# Patient Record
Sex: Female | Born: 1975
Health system: Southern US, Community
[De-identification: ages and names within clinical notes are randomized; demographics above are authoritative.]

## PROBLEM LIST (undated history)

## (undated) DIAGNOSIS — E039 Hypothyroidism, unspecified: Secondary | ICD-10-CM

## (undated) DIAGNOSIS — N289 Disorder of kidney and ureter, unspecified: Secondary | ICD-10-CM

## (undated) DIAGNOSIS — M1990 Unspecified osteoarthritis, unspecified site: Secondary | ICD-10-CM

## (undated) DIAGNOSIS — Z5189 Encounter for other specified aftercare: Secondary | ICD-10-CM

## (undated) DIAGNOSIS — T4145XA Adverse effect of unspecified anesthetic, initial encounter: Secondary | ICD-10-CM

## (undated) DIAGNOSIS — E079 Disorder of thyroid, unspecified: Secondary | ICD-10-CM

## (undated) DIAGNOSIS — J984 Other disorders of lung: Secondary | ICD-10-CM

## (undated) DIAGNOSIS — N730 Acute parametritis and pelvic cellulitis: Secondary | ICD-10-CM

## (undated) DIAGNOSIS — K219 Gastro-esophageal reflux disease without esophagitis: Secondary | ICD-10-CM

## (undated) DIAGNOSIS — N938 Other specified abnormal uterine and vaginal bleeding: Secondary | ICD-10-CM

## (undated) DIAGNOSIS — D649 Anemia, unspecified: Secondary | ICD-10-CM

## (undated) HISTORY — DX: Other specified abnormal uterine and vaginal bleeding: N93.8

## (undated) HISTORY — DX: Anemia, unspecified: D64.9

## (undated) HISTORY — DX: Acute parametritis and pelvic cellulitis: N73.0

## (undated) HISTORY — DX: Other disorders of lung: J98.4

## (undated) HISTORY — PX: OTHER SURGICAL HISTORY: SHX169

---

## 1998-05-18 HISTORY — PX: CHOLECYSTECTOMY: SHX55

## 2003-05-19 HISTORY — PX: TUBAL LIGATION: SHX77

## 2009-05-06 ENCOUNTER — Ambulatory Visit: Payer: Self-pay | Admitting: Critical Care Medicine

## 2009-05-07 ENCOUNTER — Ambulatory Visit: Payer: Self-pay | Admitting: Cardiology

## 2009-05-07 ENCOUNTER — Encounter (INDEPENDENT_AMBULATORY_CARE_PROVIDER_SITE_OTHER): Payer: Self-pay | Admitting: Internal Medicine

## 2009-05-07 ENCOUNTER — Inpatient Hospital Stay (HOSPITAL_COMMUNITY): Admission: EM | Admit: 2009-05-07 | Discharge: 2009-05-14 | Payer: Self-pay | Admitting: Emergency Medicine

## 2009-05-08 ENCOUNTER — Encounter (INDEPENDENT_AMBULATORY_CARE_PROVIDER_SITE_OTHER): Payer: Self-pay | Admitting: Internal Medicine

## 2009-05-08 ENCOUNTER — Ambulatory Visit: Payer: Self-pay | Admitting: Surgery

## 2009-05-26 ENCOUNTER — Emergency Department (HOSPITAL_COMMUNITY): Admission: EM | Admit: 2009-05-26 | Discharge: 2009-05-26 | Payer: Self-pay | Admitting: Emergency Medicine

## 2009-08-09 ENCOUNTER — Ambulatory Visit: Payer: Self-pay | Admitting: Cardiology

## 2009-08-09 ENCOUNTER — Inpatient Hospital Stay (HOSPITAL_COMMUNITY)
Admission: EM | Admit: 2009-08-09 | Discharge: 2009-08-14 | Payer: Self-pay | Source: Home / Self Care | Admitting: Emergency Medicine

## 2009-08-09 ENCOUNTER — Ambulatory Visit: Payer: Self-pay | Admitting: Pulmonary Disease

## 2009-08-10 ENCOUNTER — Encounter (INDEPENDENT_AMBULATORY_CARE_PROVIDER_SITE_OTHER): Payer: Self-pay | Admitting: Internal Medicine

## 2009-08-13 ENCOUNTER — Encounter (INDEPENDENT_AMBULATORY_CARE_PROVIDER_SITE_OTHER): Payer: Self-pay | Admitting: Internal Medicine

## 2010-01-13 ENCOUNTER — Inpatient Hospital Stay (HOSPITAL_COMMUNITY): Admission: EM | Admit: 2010-01-13 | Discharge: 2010-01-14 | Payer: Self-pay | Admitting: Emergency Medicine

## 2010-05-18 HISTORY — PX: MULTIPLE TOOTH EXTRACTIONS: SHX2053

## 2010-06-08 ENCOUNTER — Encounter: Payer: Self-pay | Admitting: Surgery

## 2010-08-01 LAB — URINALYSIS, ROUTINE W REFLEX MICROSCOPIC
Bilirubin Urine: NEGATIVE
Bilirubin Urine: NEGATIVE
Glucose, UA: NEGATIVE mg/dL
Glucose, UA: NEGATIVE mg/dL
Protein, ur: 100 mg/dL — AB
Protein, ur: >300 mg/dL — AB
Specific Gravity, Urine: 1.008 (ref 1.005–1.030)
Urobilinogen, UA: 0.2 mg/dL (ref 0.0–1.0)
Urobilinogen, UA: 1 mg/dL (ref 0.0–1.0)

## 2010-08-01 LAB — DIFFERENTIAL
Basophils Absolute: 0 10*3/uL (ref 0.0–0.1)
Basophils Relative: 0 % (ref 0–1)
Eosinophils Absolute: 0.3 10*3/uL (ref 0.0–0.7)
Monocytes Relative: 7 % (ref 3–12)
Neutrophils Relative %: 55 % (ref 43–77)

## 2010-08-01 LAB — BASIC METABOLIC PANEL
BUN: 13 mg/dL (ref 6–23)
BUN: 14 mg/dL (ref 6–23)
CO2: 22 mEq/L (ref 19–32)
CO2: 25 mEq/L (ref 19–32)
Chloride: 114 mEq/L — ABNORMAL HIGH (ref 96–112)
Creatinine, Ser: 1.85 mg/dL — ABNORMAL HIGH (ref 0.4–1.2)
Glucose, Bld: 83 mg/dL (ref 70–99)
Glucose, Bld: 87 mg/dL (ref 70–99)
Potassium: 4.2 mEq/L (ref 3.5–5.1)
Potassium: 4.3 mEq/L (ref 3.5–5.1)
Sodium: 141 mEq/L (ref 135–145)

## 2010-08-01 LAB — CK: Total CK: 758 U/L — ABNORMAL HIGH (ref 7–177)

## 2010-08-01 LAB — TSH: TSH: 49.977 u[IU]/mL — ABNORMAL HIGH (ref 0.350–4.500)

## 2010-08-01 LAB — CBC
MCHC: 34.6 g/dL (ref 30.0–36.0)
RDW: 17.7 % — ABNORMAL HIGH (ref 11.5–15.5)

## 2010-08-01 LAB — POCT I-STAT, CHEM 8
BUN: 15 mg/dL (ref 6–23)
Calcium, Ion: 0.88 mmol/L — ABNORMAL LOW (ref 1.12–1.32)
Creatinine, Ser: 2.7 mg/dL — ABNORMAL HIGH (ref 0.4–1.2)
Hemoglobin: 13.6 g/dL (ref 12.0–15.0)
Sodium: 138 mEq/L (ref 135–145)
TCO2: 16 mmol/L (ref 0–100)

## 2010-08-01 LAB — URINE CULTURE: Colony Count: NO GROWTH

## 2010-08-01 LAB — URINE MICROSCOPIC-ADD ON

## 2010-08-03 LAB — CBC
HCT: 31.1 % — ABNORMAL LOW (ref 36.0–46.0)
MCHC: 34.5 g/dL (ref 30.0–36.0)
MCV: 78.1 fL (ref 78.0–100.0)
Platelets: 117 10*3/uL — ABNORMAL LOW (ref 150–400)
RDW: 15.2 % (ref 11.5–15.5)

## 2010-08-03 LAB — POCT I-STAT, CHEM 8
Creatinine, Ser: 1.4 mg/dL — ABNORMAL HIGH (ref 0.4–1.2)
HCT: 32 % — ABNORMAL LOW (ref 36.0–46.0)
Hemoglobin: 10.9 g/dL — ABNORMAL LOW (ref 12.0–15.0)
Sodium: 139 mEq/L (ref 135–145)
TCO2: 21 mmol/L (ref 0–100)

## 2010-08-03 LAB — DIFFERENTIAL
Basophils Relative: 0 % (ref 0–1)
Eosinophils Absolute: 0 10*3/uL (ref 0.0–0.7)
Eosinophils Relative: 0 % (ref 0–5)
Lymphs Abs: 0.6 10*3/uL — ABNORMAL LOW (ref 0.7–4.0)
Neutrophils Relative %: 77 % (ref 43–77)

## 2010-08-11 LAB — BASIC METABOLIC PANEL
BUN: 30 mg/dL — ABNORMAL HIGH (ref 6–23)
BUN: 32 mg/dL — ABNORMAL HIGH (ref 6–23)
BUN: 33 mg/dL — ABNORMAL HIGH (ref 6–23)
Calcium: 8.8 mg/dL (ref 8.4–10.5)
Chloride: 116 mEq/L — ABNORMAL HIGH (ref 96–112)
Chloride: 118 mEq/L — ABNORMAL HIGH (ref 96–112)
Creatinine, Ser: 0.96 mg/dL (ref 0.4–1.2)
Creatinine, Ser: 1.19 mg/dL (ref 0.4–1.2)
Creatinine, Ser: 1.19 mg/dL (ref 0.4–1.2)
GFR calc Af Amer: >60 mL/min (ref >60)
GFR calc Af Amer: >60 mL/min (ref >60)
GFR calc non Af Amer: 52 mL/min — ABNORMAL LOW (ref >60)
GFR calc non Af Amer: 52 mL/min — ABNORMAL LOW (ref >60)
GFR calc non Af Amer: >60 mL/min (ref >60)
GFR calc non Af Amer: >60 mL/min (ref >60)
Glucose, Bld: 154 mg/dL — ABNORMAL HIGH (ref 70–99)
Glucose, Bld: 157 mg/dL — ABNORMAL HIGH (ref 70–99)
Potassium: 3.9 mEq/L (ref 3.5–5.1)
Potassium: 4.5 mEq/L (ref 3.5–5.1)
Potassium: 4.7 mEq/L (ref 3.5–5.1)
Sodium: 142 mEq/L (ref 135–145)

## 2010-08-11 LAB — URINALYSIS, ROUTINE W REFLEX MICROSCOPIC
Bilirubin Urine: NEGATIVE
Protein, ur: NEGATIVE mg/dL
Urobilinogen, UA: 0.2 mg/dL (ref 0.0–1.0)

## 2010-08-11 LAB — CBC
HCT: 27.4 % — ABNORMAL LOW (ref 36.0–46.0)
HCT: 29.3 % — ABNORMAL LOW (ref 36.0–46.0)
Hemoglobin: 10 g/dL — ABNORMAL LOW (ref 12.0–15.0)
Hemoglobin: 9.2 g/dL — ABNORMAL LOW (ref 12.0–15.0)
Hemoglobin: 9.3 g/dL — ABNORMAL LOW (ref 12.0–15.0)
MCHC: 32.5 g/dL (ref 30.0–36.0)
MCHC: 33.5 g/dL (ref 30.0–36.0)
MCV: 78.9 fL (ref 78.0–100.0)
MCV: 79.7 fL (ref 78.0–100.0)
MCV: 80.1 fL (ref 78.0–100.0)
MCV: 80.3 fL (ref 78.0–100.0)
Platelets: 127 10*3/uL — ABNORMAL LOW (ref 150–400)
Platelets: 129 10*3/uL — ABNORMAL LOW (ref 150–400)
Platelets: 130 10*3/uL — ABNORMAL LOW (ref 150–400)
Platelets: 97 10*3/uL — ABNORMAL LOW (ref 150–400)
RBC: 3.45 MIL/uL — ABNORMAL LOW (ref 3.87–5.11)
RBC: 3.47 MIL/uL — ABNORMAL LOW (ref 3.87–5.11)
RBC: 3.84 MIL/uL — ABNORMAL LOW (ref 3.87–5.11)
RBC: 4.39 MIL/uL (ref 3.87–5.11)
RDW: 14.4 % (ref 11.5–15.5)
RDW: 14.4 % (ref 11.5–15.5)
RDW: 14.6 % (ref 11.5–15.5)
WBC: 5.9 10*3/uL (ref 4.0–10.5)
WBC: 6.9 10*3/uL (ref 4.0–10.5)
WBC: 7.5 10*3/uL (ref 4.0–10.5)
WBC: 9.1 10*3/uL (ref 4.0–10.5)

## 2010-08-11 LAB — POCT CARDIAC MARKERS
CKMB, poc: 1 ng/mL (ref 1.0–8.0)
Myoglobin, poc: 194 ng/mL (ref 12–200)

## 2010-08-11 LAB — COMPREHENSIVE METABOLIC PANEL
ALT: 32 U/L (ref 0–35)
ALT: 66 U/L — ABNORMAL HIGH (ref 0–35)
AST: 15 U/L (ref 0–37)
AST: 28 U/L (ref 0–37)
AST: 57 U/L — ABNORMAL HIGH (ref 0–37)
Alkaline Phosphatase: 76 U/L (ref 39–117)
BUN: 39 mg/dL — ABNORMAL HIGH (ref 6–23)
CO2: 20 mEq/L (ref 19–32)
CO2: 21 mEq/L (ref 19–32)
CO2: 21 mEq/L (ref 19–32)
Calcium: 8.7 mg/dL (ref 8.4–10.5)
Calcium: 9.4 mg/dL (ref 8.4–10.5)
Chloride: 116 mEq/L — ABNORMAL HIGH (ref 96–112)
Chloride: 116 mEq/L — ABNORMAL HIGH (ref 96–112)
Creatinine, Ser: 1.23 mg/dL — ABNORMAL HIGH (ref 0.4–1.2)
GFR calc Af Amer: >60 mL/min (ref >60)
GFR calc Af Amer: >60 mL/min (ref >60)
GFR calc Af Amer: >60 mL/min (ref >60)
GFR calc non Af Amer: 50 mL/min — ABNORMAL LOW (ref >60)
GFR calc non Af Amer: >60 mL/min (ref >60)
Glucose, Bld: 194 mg/dL — ABNORMAL HIGH (ref 70–99)
Potassium: 4 mEq/L (ref 3.5–5.1)
Sodium: 139 mEq/L (ref 135–145)
Sodium: 142 mEq/L (ref 135–145)
Total Bilirubin: 0.3 mg/dL (ref 0.3–1.2)
Total Bilirubin: 0.3 mg/dL (ref 0.3–1.2)
Total Protein: 6.9 g/dL (ref 6.0–8.3)

## 2010-08-11 LAB — BLOOD GAS, ARTERIAL
Acid-base deficit: 13.4 mmol/L — ABNORMAL HIGH (ref 0.0–2.0)
Acid-base deficit: 15.3 mmol/L — ABNORMAL HIGH (ref 0.0–2.0)
Bicarbonate: 11.8 mEq/L — ABNORMAL LOW (ref 20.0–24.0)
Bicarbonate: 14.1 mEq/L — ABNORMAL LOW (ref 20.0–24.0)
O2 Content: 5 L/min
O2 Saturation: 99 %
O2 Saturation: 99 %
Patient temperature: 98.6
Patient temperature: 98.6
TCO2: 12.1 mmol/L (ref 0–100)
TCO2: 15.1 mmol/L (ref 0–100)
pCO2 arterial: 25 mmHg — ABNORMAL LOW (ref 35.0–45.0)
pCO2 arterial: 30.9 mmHg — ABNORMAL LOW (ref 35.0–45.0)
pH, Arterial: 7.281 — ABNORMAL LOW (ref 7.350–7.400)
pO2, Arterial: 115 mmHg — ABNORMAL HIGH (ref 80.0–100.0)

## 2010-08-11 LAB — T3, FREE: T3, Free: 8.9 pg/mL — ABNORMAL HIGH (ref 2.3–4.2)

## 2010-08-11 LAB — TYPE AND SCREEN: Antibody Screen: NEGATIVE

## 2010-08-11 LAB — CARDIAC PANEL(CRET KIN+CKTOT+MB+TROPI)
CK, MB: 0.7 ng/mL (ref 0.3–4.0)
CK, MB: 1.4 ng/mL (ref 0.3–4.0)
Relative Index: INVALID (ref 0.0–2.5)
Relative Index: INVALID (ref 0.0–2.5)
Total CK: 22 U/L (ref 7–177)
Total CK: 31 U/L (ref 7–177)
Total CK: 36 U/L (ref 7–177)
Troponin I: 0.01 ng/mL (ref 0.00–0.06)
Troponin I: 0.08 ng/mL — ABNORMAL HIGH (ref 0.00–0.06)
Troponin I: 0.09 ng/mL — ABNORMAL HIGH (ref 0.00–0.06)

## 2010-08-11 LAB — DIFFERENTIAL
Eosinophils Absolute: 0.1 10*3/uL (ref 0.0–0.7)
Eosinophils Relative: 1 % (ref 0–5)
Lymphs Abs: 2.9 10*3/uL (ref 0.7–4.0)
Monocytes Relative: 11 % (ref 3–12)

## 2010-08-11 LAB — MAGNESIUM
Magnesium: 1.8 mg/dL (ref 1.5–2.5)
Magnesium: 1.9 mg/dL (ref 1.5–2.5)

## 2010-08-11 LAB — T3: T3, Total: 81.1 ng/dl (ref 80.0–204.0)

## 2010-08-11 LAB — URINE MICROSCOPIC-ADD ON

## 2010-08-11 LAB — LACTIC ACID, PLASMA: Lactic Acid, Venous: 9 mmol/L — ABNORMAL HIGH (ref 0.5–2.2)

## 2010-08-11 LAB — RAPID URINE DRUG SCREEN, HOSP PERFORMED
Amphetamines: NOT DETECTED
Cocaine: NOT DETECTED
Opiates: NOT DETECTED
Tetrahydrocannabinol: NOT DETECTED

## 2010-08-11 LAB — HEPARIN INDUCED THROMBOCYTOPENIA PNL
Patient O.D.: 0.256
UFH Low Dose 0.1 IU/mL: 0 % Release

## 2010-08-11 LAB — TSH: TSH: 0.006 u[IU]/mL — ABNORMAL LOW (ref 0.350–4.500)

## 2010-08-18 LAB — BLOOD GAS, ARTERIAL
Acid-base deficit: 4.2 mmol/L — ABNORMAL HIGH (ref 0.0–2.0)
O2 Saturation: 98.7 %
pO2, Arterial: 104 mmHg — ABNORMAL HIGH (ref 80.0–100.0)

## 2010-08-18 LAB — COMPREHENSIVE METABOLIC PANEL
ALT: 21 U/L (ref 0–35)
ALT: 30 U/L (ref 0–35)
AST: 23 U/L (ref 0–37)
AST: 40 U/L — ABNORMAL HIGH (ref 0–37)
Albumin: 2.9 g/dL — ABNORMAL LOW (ref 3.5–5.2)
Albumin: 3.1 g/dL — ABNORMAL LOW (ref 3.5–5.2)
Alkaline Phosphatase: 88 U/L (ref 39–117)
Alkaline Phosphatase: 93 U/L (ref 39–117)
BUN: 20 mg/dL (ref 6–23)
BUN: 24 mg/dL — ABNORMAL HIGH (ref 6–23)
CO2: 19 mEq/L (ref 19–32)
CO2: 22 mEq/L (ref 19–32)
CO2: 23 mEq/L (ref 19–32)
Calcium: 8.3 mg/dL — ABNORMAL LOW (ref 8.4–10.5)
Calcium: 9.1 mg/dL (ref 8.4–10.5)
Chloride: 116 mEq/L — ABNORMAL HIGH (ref 96–112)
Chloride: 116 mEq/L — ABNORMAL HIGH (ref 96–112)
Creatinine, Ser: 1.18 mg/dL (ref 0.4–1.2)
GFR calc Af Amer: 58 mL/min — ABNORMAL LOW (ref >60)
GFR calc Af Amer: >60 mL/min (ref >60)
GFR calc Af Amer: >60 mL/min (ref >60)
GFR calc non Af Amer: 48 mL/min — ABNORMAL LOW (ref >60)
GFR calc non Af Amer: 52 mL/min — ABNORMAL LOW (ref >60)
GFR calc non Af Amer: >60 mL/min (ref >60)
Glucose, Bld: 100 mg/dL — ABNORMAL HIGH (ref 70–99)
Potassium: 3.8 mEq/L (ref 3.5–5.1)
Potassium: 3.8 mEq/L (ref 3.5–5.1)
Potassium: 4.3 mEq/L (ref 3.5–5.1)
Sodium: 145 mEq/L (ref 135–145)
Sodium: 145 mEq/L (ref 135–145)
Total Bilirubin: 0.3 mg/dL (ref 0.3–1.2)
Total Bilirubin: 0.6 mg/dL (ref 0.3–1.2)
Total Protein: 5.4 g/dL — ABNORMAL LOW (ref 6.0–8.3)

## 2010-08-18 LAB — ANGIOTENSIN CONVERTING ENZYME: Angiotensin-Converting Enzyme: 64 U/L (ref 9–67)

## 2010-08-18 LAB — CBC
HCT: 23.6 % — ABNORMAL LOW (ref 36.0–46.0)
Hemoglobin: 7.8 g/dL — ABNORMAL LOW (ref 12.0–15.0)
MCHC: 33.4 g/dL (ref 30.0–36.0)
MCHC: 33.4 g/dL (ref 30.0–36.0)
MCV: 79.5 fL (ref 78.0–100.0)
MCV: 80.9 fL (ref 78.0–100.0)
Platelets: 125 10*3/uL — ABNORMAL LOW (ref 150–400)
Platelets: 134 10*3/uL — ABNORMAL LOW (ref 150–400)
RBC: 3.44 MIL/uL — ABNORMAL LOW (ref 3.87–5.11)
RDW: 14.4 % (ref 11.5–15.5)
RDW: 15.4 % (ref 11.5–15.5)
WBC: 6.3 10*3/uL (ref 4.0–10.5)

## 2010-08-18 LAB — CROSSMATCH

## 2010-08-18 LAB — URINALYSIS, ROUTINE W REFLEX MICROSCOPIC
Glucose, UA: NEGATIVE mg/dL
Ketones, ur: NEGATIVE mg/dL
Protein, ur: NEGATIVE mg/dL

## 2010-08-18 LAB — BASIC METABOLIC PANEL
BUN: 17 mg/dL (ref 6–23)
CO2: 19 mEq/L (ref 19–32)
Calcium: 9.5 mg/dL (ref 8.4–10.5)
Creatinine, Ser: 1.03 mg/dL (ref 0.4–1.2)
GFR calc Af Amer: >60 mL/min (ref >60)
GFR calc non Af Amer: 54 mL/min — ABNORMAL LOW (ref >60)
Glucose, Bld: 96 mg/dL (ref 70–99)
Potassium: 4 mEq/L (ref 3.5–5.1)
Sodium: 145 mEq/L (ref 135–145)

## 2010-08-18 LAB — HEPATIC FUNCTION PANEL
ALT: 24 U/L (ref 0–35)
AST: 26 U/L (ref 0–37)
Alkaline Phosphatase: 98 U/L (ref 39–117)
Bilirubin, Direct: 0.2 mg/dL (ref 0.0–0.3)
Total Bilirubin: 1 mg/dL (ref 0.3–1.2)

## 2010-08-18 LAB — IRON AND TIBC
Iron: 65 ug/dL (ref 42–135)
UIBC: 164 ug/dL

## 2010-08-18 LAB — SEDIMENTATION RATE: Sed Rate: 15 mm/hr (ref 0–22)

## 2010-08-18 LAB — LIPID PANEL: Cholesterol: 89 mg/dL (ref 0–200)

## 2010-08-18 LAB — POCT PREGNANCY, URINE: Preg Test, Ur: NEGATIVE

## 2010-08-18 LAB — HEMOCCULT GUIAC POC 1CARD (OFFICE): Fecal Occult Bld: NEGATIVE

## 2010-08-18 LAB — T4: T4, Total: 25.4 ug/dL — ABNORMAL HIGH (ref 5.0–12.5)

## 2010-08-18 LAB — CULTURE, BLOOD (ROUTINE X 2): Culture: NO GROWTH

## 2010-08-18 LAB — T-HELPER CELLS (CD4) COUNT (NOT AT ARMC): CD4 T Cell Abs: 630 uL (ref 400–2700)

## 2010-08-18 LAB — URINE MICROSCOPIC-ADD ON

## 2010-08-18 LAB — LACTIC ACID, PLASMA: Lactic Acid, Venous: 0.8 mmol/L (ref 0.5–2.2)

## 2010-08-18 LAB — TSH
TSH: 0.007 u[IU]/mL — ABNORMAL LOW (ref 0.350–4.500)
TSH: 0.008 u[IU]/mL — ABNORMAL LOW (ref 0.350–4.500)

## 2010-08-18 LAB — HIV ANTIBODY (ROUTINE TESTING W REFLEX): HIV: NONREACTIVE

## 2010-11-19 ENCOUNTER — Emergency Department (HOSPITAL_COMMUNITY): Payer: Medicaid Other

## 2010-11-19 ENCOUNTER — Inpatient Hospital Stay (HOSPITAL_COMMUNITY)
Admission: EM | Admit: 2010-11-19 | Discharge: 2010-11-21 | DRG: 313 | Disposition: A | Discharge status: Home / Self Care | Payer: Medicaid Other | Attending: Internal Medicine | Admitting: Internal Medicine

## 2010-11-19 DIAGNOSIS — N643 Galactorrhea not associated with childbirth: Secondary | ICD-10-CM | POA: Diagnosis present

## 2010-11-19 DIAGNOSIS — N183 Chronic kidney disease, stage 3 unspecified: Secondary | ICD-10-CM | POA: Diagnosis present

## 2010-11-19 DIAGNOSIS — Z79899 Other long term (current) drug therapy: Secondary | ICD-10-CM

## 2010-11-19 DIAGNOSIS — N039 Chronic nephritic syndrome with unspecified morphologic changes: Secondary | ICD-10-CM | POA: Diagnosis present

## 2010-11-19 DIAGNOSIS — M6282 Rhabdomyolysis: Secondary | ICD-10-CM | POA: Diagnosis present

## 2010-11-19 DIAGNOSIS — D631 Anemia in chronic kidney disease: Secondary | ICD-10-CM | POA: Diagnosis present

## 2010-11-19 DIAGNOSIS — J45909 Unspecified asthma, uncomplicated: Secondary | ICD-10-CM | POA: Diagnosis present

## 2010-11-19 DIAGNOSIS — E039 Hypothyroidism, unspecified: Secondary | ICD-10-CM | POA: Diagnosis present

## 2010-11-19 DIAGNOSIS — F1021 Alcohol dependence, in remission: Secondary | ICD-10-CM | POA: Diagnosis present

## 2010-11-19 DIAGNOSIS — R0789 Other chest pain: Principal | ICD-10-CM | POA: Diagnosis present

## 2010-11-19 LAB — URINALYSIS, ROUTINE W REFLEX MICROSCOPIC
Bilirubin Urine: NEGATIVE
Glucose, UA: NEGATIVE mg/dL
Specific Gravity, Urine: 1.019 (ref 1.005–1.030)
Urobilinogen, UA: 0.2 mg/dL (ref 0.0–1.0)

## 2010-11-19 LAB — CK TOTAL AND CKMB (NOT AT ARMC)
CK, MB: 8 ng/mL (ref 0.3–4.0)
Total CK: 1345 U/L — ABNORMAL HIGH (ref 7–177)

## 2010-11-19 LAB — D-DIMER, QUANTITATIVE: D-Dimer, Quant: 0.71 ug/mL-FEU — ABNORMAL HIGH (ref 0.00–0.48)

## 2010-11-19 LAB — DIFFERENTIAL
Basophils Absolute: 0.1 10*3/uL (ref 0.0–0.1)
Lymphocytes Relative: 38 % (ref 12–46)
Monocytes Absolute: 0.4 10*3/uL (ref 0.1–1.0)
Neutro Abs: 3.3 10*3/uL (ref 1.7–7.7)
Neutrophils Relative %: 51 % (ref 43–77)

## 2010-11-19 LAB — CBC
HCT: 28 % — ABNORMAL LOW (ref 36.0–46.0)
MCH: 25.9 pg — ABNORMAL LOW (ref 26.0–34.0)
MCHC: 32.9 g/dL (ref 30.0–36.0)
RDW: 13.6 % (ref 11.5–15.5)

## 2010-11-19 LAB — BASIC METABOLIC PANEL
BUN: 15 mg/dL (ref 6–23)
CO2: 23 mEq/L (ref 19–32)
GFR calc non Af Amer: 21 mL/min — ABNORMAL LOW (ref >60)
Glucose, Bld: 85 mg/dL (ref 70–99)
Potassium: 4.1 mEq/L (ref 3.5–5.1)

## 2010-11-19 LAB — URINE MICROSCOPIC-ADD ON

## 2010-11-19 LAB — PREGNANCY, URINE: Preg Test, Ur: NEGATIVE

## 2010-11-20 ENCOUNTER — Inpatient Hospital Stay (HOSPITAL_COMMUNITY): Payer: Medicaid Other

## 2010-11-20 LAB — PRO B NATRIURETIC PEPTIDE: Pro B Natriuretic peptide (BNP): 131.4 pg/mL — ABNORMAL HIGH (ref 0–125)

## 2010-11-20 LAB — COMPREHENSIVE METABOLIC PANEL
ALT: 11 U/L (ref 0–35)
AST: 28 U/L (ref 0–37)
Calcium: 7.6 mg/dL — ABNORMAL LOW (ref 8.4–10.5)
Creatinine, Ser: 2.41 mg/dL — ABNORMAL HIGH (ref 0.50–1.10)
Sodium: 140 mEq/L (ref 135–145)
Total Protein: 6 g/dL (ref 6.0–8.3)

## 2010-11-20 LAB — TSH: TSH: 119.064 u[IU]/mL — ABNORMAL HIGH (ref 0.350–4.500)

## 2010-11-20 LAB — CBC
MCH: 25.5 pg — ABNORMAL LOW (ref 26.0–34.0)
MCHC: 32.3 g/dL (ref 30.0–36.0)
Platelets: 125 10*3/uL — ABNORMAL LOW (ref 150–400)
RDW: 13.8 % (ref 11.5–15.5)

## 2010-11-20 LAB — T4, FREE: Free T4: 0.12 ng/dL — ABNORMAL LOW (ref 0.80–1.80)

## 2010-11-20 LAB — T3, FREE: T3, Free: 0.4 pg/mL — ABNORMAL LOW (ref 2.3–4.2)

## 2010-11-20 MED ORDER — XENON XE 133 GAS
10.0000 | GAS_FOR_INHALATION | Freq: Once | RESPIRATORY_TRACT | Status: AC | PRN
  Administered 2010-11-20: 10 via RESPIRATORY_TRACT

## 2010-11-20 MED ORDER — TECHNETIUM TO 99M ALBUMIN AGGREGATED
6.0000 | Freq: Once | INTRAVENOUS | Status: AC | PRN
  Administered 2010-11-20: 6 via INTRAVENOUS

## 2010-11-21 LAB — BASIC METABOLIC PANEL
BUN: 14 mg/dL (ref 6–23)
CO2: 20 mEq/L (ref 19–32)
Calcium: 7.5 mg/dL — ABNORMAL LOW (ref 8.4–10.5)
Chloride: 113 mEq/L — ABNORMAL HIGH (ref 96–112)
Creatinine, Ser: 2.24 mg/dL — ABNORMAL HIGH (ref 0.50–1.10)

## 2010-11-21 LAB — CBC
HCT: 28 % — ABNORMAL LOW (ref 36.0–46.0)
MCHC: 32.1 g/dL (ref 30.0–36.0)
MCV: 79.3 fL (ref 78.0–100.0)
Platelets: 127 10*3/uL — ABNORMAL LOW (ref 150–400)
RDW: 13.7 % (ref 11.5–15.5)
WBC: 4.6 10*3/uL (ref 4.0–10.5)

## 2010-11-21 LAB — CK TOTAL AND CKMB (NOT AT ARMC)
CK, MB: 8 ng/mL (ref 0.3–4.0)
Relative Index: 0.7 (ref 0.0–2.5)

## 2010-11-21 LAB — PROLACTIN: Prolactin: 21.5 ng/mL

## 2010-11-27 NOTE — H&P (Signed)
NAMECYPRESS, HINKSON          ACCOUNT NO.:  [id number]  MEDICAL RECORD NO.:  [id number]  LOCATION:  [facility]                         FACILITY:  [facility]  PHYSICIAN:  Jonny Ruiz, MD    DATE OF BIRTH:  07-06-1975  DATE OF ADMISSION:  11/19/2010 DATE OF DISCHARGE:                             HISTORY & PHYSICAL   CHIEF COMPLAINT:  Chest pain.  HISTORY OF PRESENT ILLNESS:  The patient is a 35 year old white female who presents to the ED because of 1 day of retrosternal chest pain, intermittent, sharp, 5/10 in severity, not radiating, associated to palpitations and not associated to lightheadedness, dizziness, shortness of breath, or syncope.  The patient states that she has had chest pains before but never like this one before.  The patient was brought by a friend since she cannot drive.  PAST MEDICAL HISTORY:  Significant for: 1. Hyperthyroidism with thyroid storm requiring admission to our     institution, followed by radioactive iodine at [facility].  At present, the patient is hypothyroid. 2. Asthma. 3. Chronic kidney disease. 4. Rhabdomyolysis. 5. Dysfunctional uterine bleeding. 6. Anemia of renal disease. 7. Alcoholism, quit drinking about 10 months ago, drank since early     teenage up to one fifth of vodka per day. 8. Supraventricular tachycardia. 9. Edema, evaluated with right heart cardiac catheterization and CT     scan.  MEDICATIONS:  The patient is on Synthroid 88 mcg per day.  ALLERGIES: 1. CODEINE. 2. PENICILLIN.  PAST SURGICAL HISTORY:  Bilateral tubal ligation.  FAMILY HISTORY:  Father with alcoholism and some kind of heart condition that she could not elaborate.  Mother, did not know well.  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  She has 5 children.  She is a single mother living with her mother.  She denies tobacco and she admits smoking marijuana when she was a teenager.  REVIEW OF SYSTEMS:   CONSTITUTIONAL:  The patient has not had any changes in her weight recently and denies fever, chills, or night sweats. CARDIOVASCULAR:  Denies orthopnea, nocturia, or PND.  Positive for edema of the lower extremities at the end of the day.  RESPIRATORY:  Has not had any flares of asthma.  She uses Advair and albuterol p.r.n.  Denies any wheezes, cough, or hemoptysis.  GASTROINTESTINAL:  She has no nausea, vomiting, diarrhea, or constipation.  She denies heartburn or reflux.  GENITOURINARY:  Has not had any recent uterine bleeding, abnormal uterine bleeding and denies vaginal discharge, dysuria, frequency, or hematuria.  NEUROLOGIC:  She denies headaches, focal weakness, numbness, or paresthesias.  PHYSICAL EXAMINATION:  VITAL SIGNS:  Temperature 98.4, respirations 20, heart rate 58, blood pressure 101/59, and pulse oximetry 100% on room air. GENERAL APPEARANCE:  The patient is an overweight Caucasian female who is in no acute distress.  She is pleasant and cooperative. HEENT:  Eyes anicteric.  Pupils 3 mm in diameter, reactive to light, EOMI.  Nose, no drainage.  Oral cavity unremarkable. NECK:  Supple.  No lymphadenopathy.  Thyroid normal size, nontender, no nodules and no bruits. HEART:  Regular S1 and S2 without gallops, murmurs, or rubs.  There  is tenderness on palpation of the sternum. LUNGS:  Clear to auscultation without wheezes. ABDOMEN:  Soft and nontender without organomegaly or masses palpable. EXTREMITIES:  Trace edema bilaterally.  There is tenderness on palpation of the left calf.  No cords palpated.  No erythema or warmth. NEUROLOGIC:  Nonfocal.  LABORATORY DATA:  Sodium 140, potassium 4.1, chloride 108, CO2 of 23, BUN 15, creatinine 2.58, glucose 85, and calcium 8.4.  WBC 6.5, hemoglobin 9.2, hematocrit 28, and platelets 149.  Her troponin is negative.  Her CK is 1345, CK-MB 8.0.  D-dimer is 0.71.  Urine pregnancy test was negative.  Her UA showed greater than 300  protein.  Her chest x- ray was negative.  MEDICAL DECISION MAKING: 1. Chest pain in a patient with chronic kidney disease and now     presenting with calf tenderness and a positive D-dimer, highly     suggest the possibility of thromboembolic disease.  Therefore, we     will admit the patient for further evaluation and management.  At     this time, V/Q scan is not available.  Therefore, we will go ahead     and anticoagulate the patient with full dose of Lovenox and obtain     V/Q scan in the morning. CT angiogram contraindicated in this     patient with chronic kidney disease. 2. Rhabdomyolysis.  We will obtain a TSH, free T4 and T3 to see if she     is well controlled with Synthroid 88.  In the meantime, we will     hydrate her with IV normal saline at 150 ml/hour and obtain another     total CK in a.m. 3. 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  Check thyroid cascade profile today and     continue Synthroid 88 mcg a day. 4. Asthma.  Stable at this point.  We will give her albuterol as     needed. 5. Chronic kidney disease.  After reviewing her records since 2010,     she has fluctuating creatinine from 1.6 to 2.7.  Therefore, at this     point, we will not do anything else.  She had an ultrasound last     year that showed chronic kidney disease. 6. Anemia, likely renal in etiology.  We will repeat an H and H in the     morning. 7. Social.  The patient has had a history of inconsistent outpatient     follow up due to access to care.  Therefore, I will place a     consultation with social worker to further assess and need for     assistance.  The patient has been admitted to Team 3 to telemetry unit.          ______________________________ Jonny Ruiz, MD     GL/MEDQ  D:  11/20/2010  T:  11/20/2010  Job:  [id number]  cc:   Dr. Alwyn Ren  Electronically Signed by Jonny Ruiz MD on 11/27/2010 04:53:23 PM

## 2010-12-03 NOTE — Discharge Summary (Signed)
[name], Lori Murphy          ACCOUNT NO.:  [id number]  MEDICAL RECORD NO.:  [id number]  LOCATION:  2507                         FACILITY:  [facility]  PHYSICIAN:  Thad Ranger, MD       DATE OF BIRTH:  1975-10-13  DATE OF ADMISSION:  11/19/2010 DATE OF DISCHARGE:                        DISCHARGE SUMMARY - REFERRING   DISCHARGE DIAGNOSES: 1. Atypical chest pain, resolved.  V/Q scan low probability for     pulmonary embolism. 2. Profound hypothyroidism. 3. Rhabdomyolysis, likely precipitated secondary to profound     hypothyroidism. 4. Asthma. 5. Chronic kidney disease stage 3. 6. Anemia.   DISCHARGE MEDICATIONS: 1. Multivitamin one tablet p.o. daily. 2. Ventolin inhaler 2 puffs inhaled every 4 hours as needed.  BRIEF HISTORY OF PRESENT ILLNESS:  At the time of admission, Lori Murphy is a 35 year old female, who presented to emergency room with 1-day history of retrosternal chest pain, intermittent, sharp, 5/10 in severity, nonradiating, associated with palpitation, and no association with lightheadedness, dizziness, shortness of breath, or syncope.  RADIOLOGICAL DATA:  Chest x-ray two-view, November 19, 2010, no active cardiopulmonary disease.  V/Q scan November 20, 2010, no segmental or lobar ventilation or perfusion defects are seen.  No ventilation perfusion mismatch is obtained, examination low probability for pulmonary embolism, diffuse washout of the xenon gas is consistent with airtrapping and obstructive of pulmonary disease.  PERTINENT LAB DIAGNOSTIC DATA:  CBC at the time of admission white count 6.5, hemoglobin 9.2, hematocrit 28.0, platelets 149.  CK elevated at 1345.  Troponin less than 0.3.  UA showed more than 300 protein, negative nitrites, leukocytes.  Urine pregnancy test negative.  D-dimer elevated at 0.71.  BNP 131.  TSH profoundly elevated at 119.0, free T4 low at 0.12, free T3 low at 0.4, prolactin level 21.5. BMET at the time of admission, November 19, 2010,  BUN 15 and creatinine 2.58. At the time of discharge, CK 1080, BUN 14, creatinine 2.2.  BRIEF HOSPITALIZATION COURSE: 1. Atypical chest pain, resolved.  V/Q scan was obtained, which was     negative for any PE.  The patient was given full dose of Lovenox. 2. Rhabdomyolysis, possibly secondary to profound hypothyroidism.  The     patient was well hydrated with IV fluids.  Levothyroxine was     increased. 3. Profound hypothyroidism.  TSH was elevated at 119 with depressed T4     and T3.  Possibly due to history of inconsistent outpatient     followup.  Levothyroxine was increased to 112     mcg daily and I strongly counseled the patient on follow up and     obtaining monthly TSH and thyroid function test until within normal     levels. 4. Chronic kidney disease stage 3.  After reviewing her records in     2010, she has a fluctuating creatinine from 1.6 to 2.7.  At the     time of discharge, her creatinine has improved to 2.2 from 2.58 at     the time of admission.  The patient will likely benefit from     outpatient Nephrology follow up.  She wants to follow up with     Nephrology in [place].  DISPOSITION:  Case  management and social worker consultation was obtained for outpatient followup and the patient has a followup appointment with [facility] on November 24, 2010, at 4 p.m.  PHYSICAL EXAMINATION:  VITAL SIGNS:  At the time of dictation, BP 116/54, temperature 99.4, pulse 72, respirations 16, O2 saturations 98% on room air. GENERAL:  The patient is alert, awake, and oriented x3, not in any acute distress. HEENT:  Anicteric sclerae.  Clear conjunctivae.  Pupils reactive to light and accommodation.  EOMI. NECK: Supple.  No lymphadenopathy.  No JVD. CVS:  S1 and S2 clear. CHEST:  Clear to auscultation bilaterally. ABDOMEN:  Soft, nontender, and nondistended.  Normal bowel sounds. EXTREMITIES:  No cyanosis, clubbing, or edema noted in upper or lower extremities  bilaterally.  DISCHARGE FOLLOWUP:  Follow up with [facility] on November 24, 2010.  DISCHARGE TIME:  35 minutes.     Thad Ranger, MD     RR/MEDQ  D:  11/21/2010  T:  11/21/2010  Job:  [id number]  cc:   [facility]  Electronically Signed by Andres Labrum Sabina Beavers  on 12/03/2010 05:42:24 PM

## 2011-01-21 IMAGING — CT CT ANGIO CHEST
3 of 11 series · 16 of 37 positions shown · IV contrast (APPLIED)
Comparison: Chest x-ray of 05/06/2009
COMPARISON: None.

CT ABDOMEN

CLINICAL DATA: Bilateral leg edema, sinus tachycardia, evaluate
for pulmonary embolism or possible abdominal or pelvic mass

CT ANGIOGRAPHY CHEST WITH CONTRAST, CT ABDOMEN AND PELVIS WITH
CONTRAST
TECHNIQUE: Multidetector CT imaging of the chest was performed
using the standard protocol during bolus administration of
intravenous contrast.  Multiplanar CT image reconstructions
including MIPs were obtained to evaluate the vascular anatomy.
Contrast:  100 ml [id number]
TECHNIQUE: Multidetector CT imaging of the abdomen and pelvis was
performed using the standard protocol following bolus
administration of intravenous contrast.

[Series 5: pulm embolism 3.0 b60f lung · axial · 0.70mm/px · z∈[+1270,+1366]mm · 2 of 98 slices shown]
[im 33/98  mediastinal]
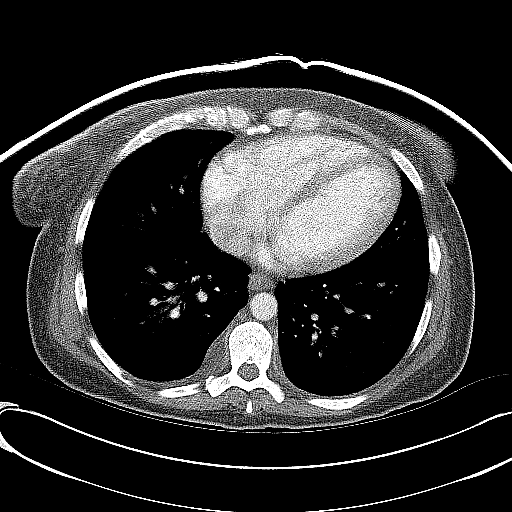
[im 65/98  mediastinal]
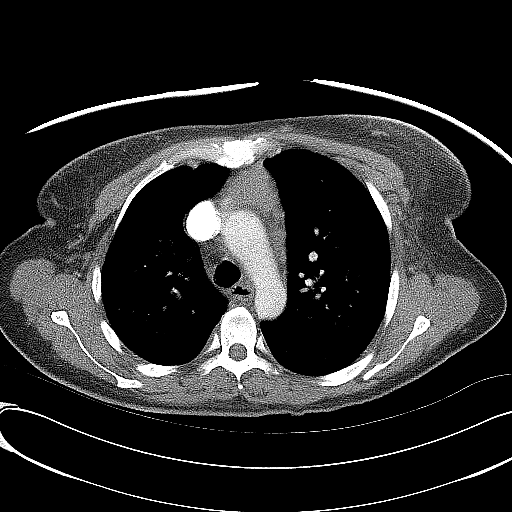

[Series 8: pulm embolism 1.0 b25f thins · axial · 0.70mm/px · z∈[+1166,+1440]mm · 12 of 325 slices shown]
[im 25/325  lung]
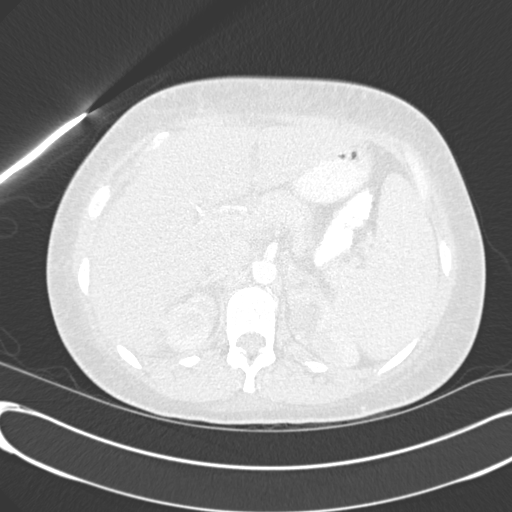
[im 50/325  mediastinal]
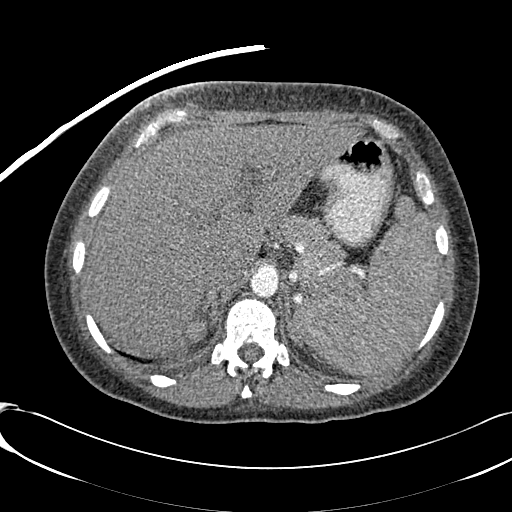
[im 75/325  lung]
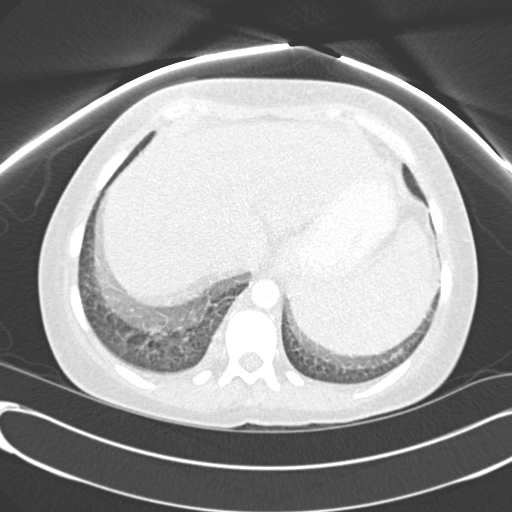
[im 100/325  mediastinal]
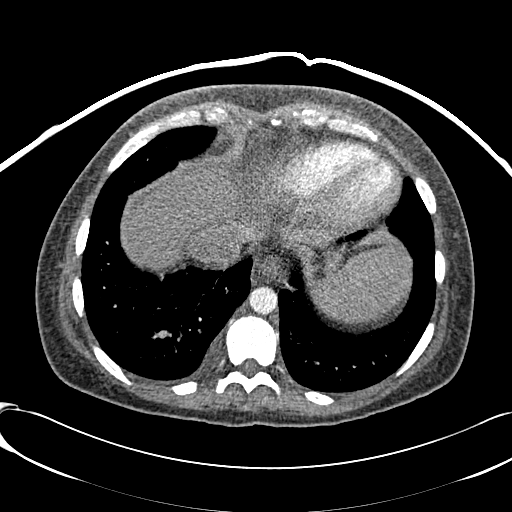
[im 125/325  lung]
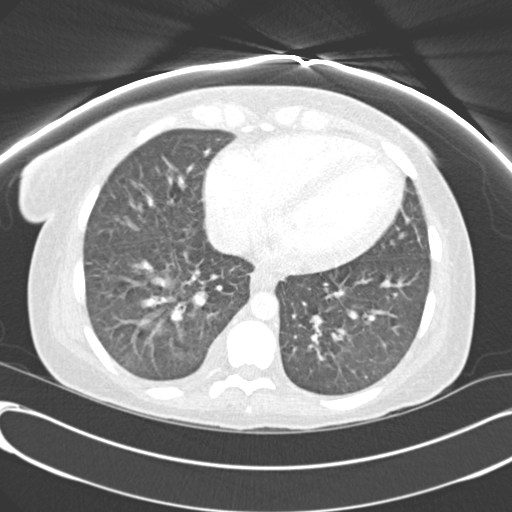
[im 150/325  mediastinal]
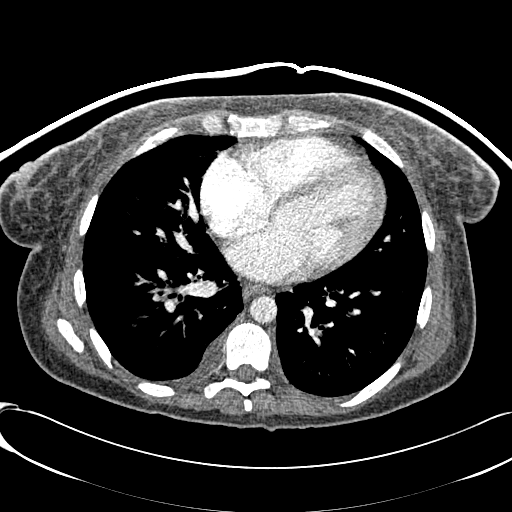
[im 175/325  lung]
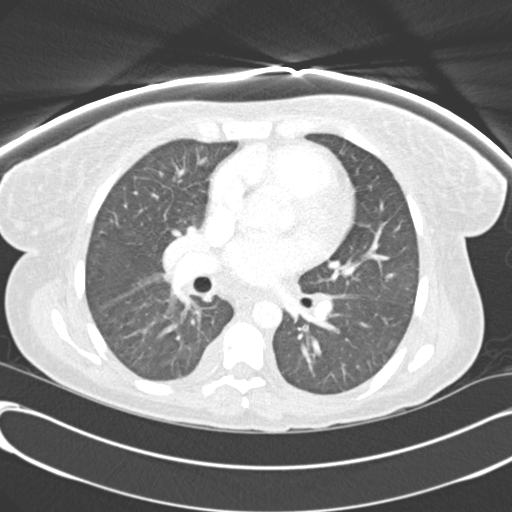
[im 200/325  mediastinal]
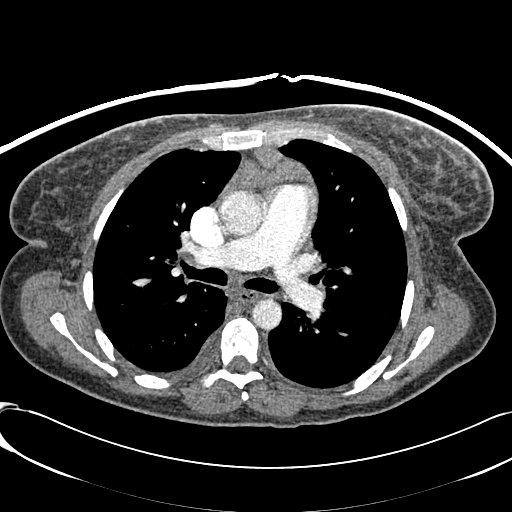
[im 225/325  lung]
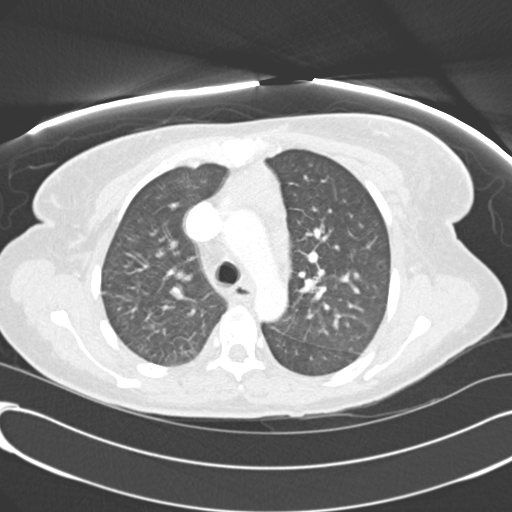
[im 250/325  mediastinal]
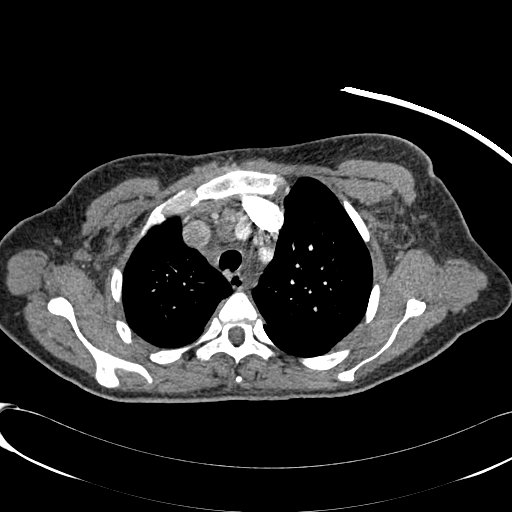
[im 275/325  lung]
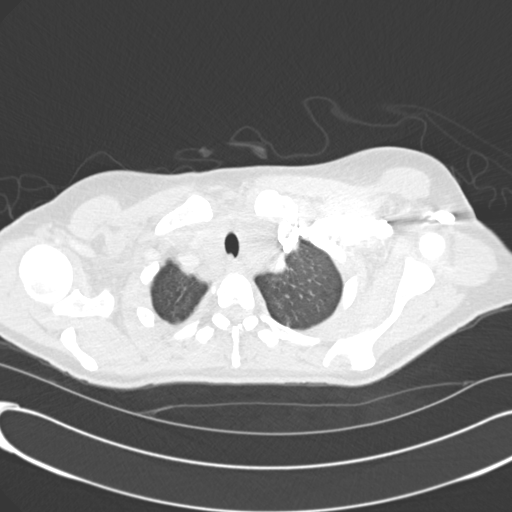
[im 300/325  mediastinal]
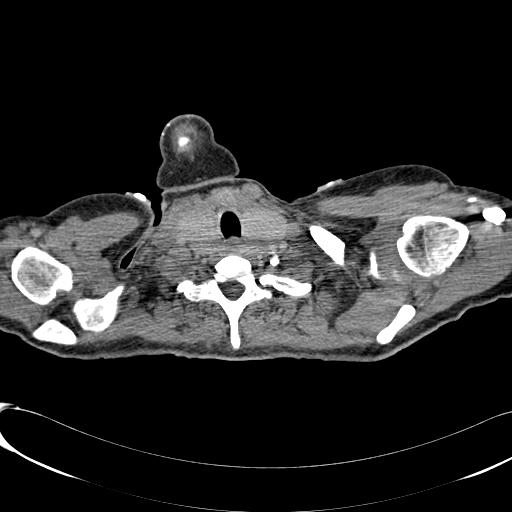

[Series 9: abd/pelv with 5.0 b31f st · axial · 0.76mm/px · z∈[+972,+1118]mm · 2 of 87 slices shown]
[im 29/87  lung]
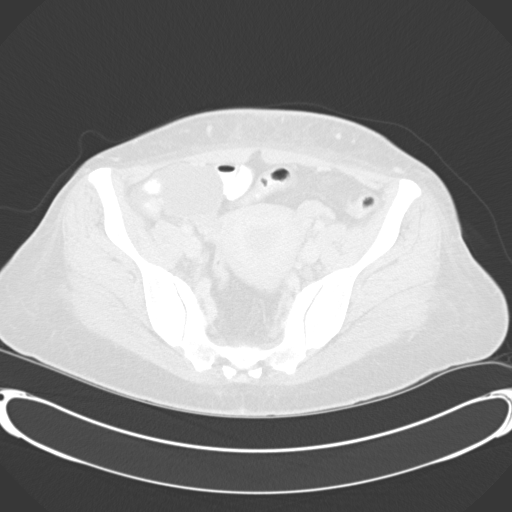
[im 58/87  lung]
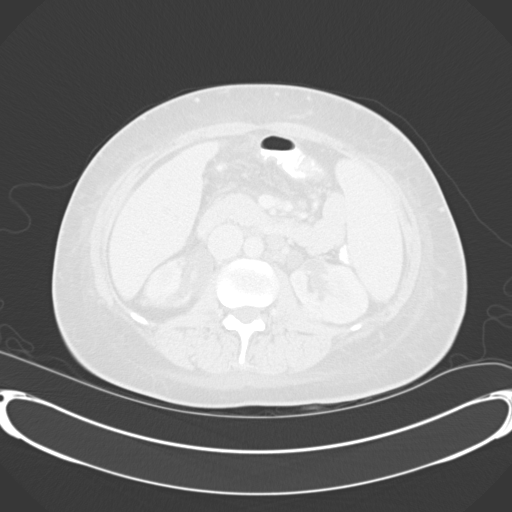

[16 of 37 positions shown; findings below may reference images not displayed]

FINDINGS: On mediastinal window images, there is diffuse
enlargement of the thyroid gland consistent with thyroid goiter.
The pulmonary arteries opacify with no evidence of acute pulmonary
embolism.  The thoracic aorta opacifies with no acute abnormality
noted.  There is a slightly prominent superior mediastinal node of
11 x 14 mm.  No other evidence of mediastinal or hilar adenopathy
is seen. Only tiny pleural effusions are noted. However, within the
anterior mediastinum there is abnormal soft tissue present bulging
the contours.  The considerations are that of thymic tumor,
teratoma or lymphoma.  There is subcutaneous strandiness throughout
the soft tissues diffusely consistent with fluid overload state
i.e. anasarca.

On the lung window images there are somewhat prominent interstitial
markings particularly in the lower lobes very suspicious for
interstitial edema.  No focal infiltrate or nodule is seen.

Review of the MIP images confirms the above findings.
IMPRESSION: 1.  No evidence of acute pulmonary embolism.
 2.  Abnormal soft tissue in the anterior superior mediastinum
consistent with either thymic tumor, teratoma, or lymphoma.  A
single slightly prominent superior mediastinal lymph node is
present as well.
 3.  Diffuse prominence of the thyroid gland.  Question thyroid
goiter.
4.  Prominent interstitial markings at the lung bases suspicious
for interstitial edema.

CT ABDOMEN AND PELVIS WITH CONTRAST
FINDINGS: The liver enhances with no focal abnormality.  However
there is periportal edema present which can be seen with fluid
overload, HIV/AIDS, or hepatitis. Surgical clips are present from
prior cholecystectomy.  The pancreas is normal in size and the
pancreatic duct is not dilated.  The adrenal glands are normal in
size and the spleen is minimally prominent.  There is malformation
of both kidneys with areas of loss of parenchyma and poor
perfusion.  Although some of these changes could be acute, i.e.
pyelonephritis, the majority of these changes appear to be chronic
and longstanding, possibly dystrophic kidneys congenitally.  The
ureters are prominent into the pelvis.  There is some periaortic
adenopathy present. The abdominal aorta is normal in caliber.
IMPRESSION: 1.  Portal edema.  Consider fluid overload, HIV/AIDS, or hepatitis.
2.  Abnormality of both kidneys which may be congenital possibly
due to dysplastic kidneys, but acute pyelonephritis cannot be
excluded with areas of poor perfusion noted.
3.  Dilated ureters into the pelvis.
4.  Periaortic retroperitoneal adenopathy.
5.  Mild splenomegaly.

CT PELVIS
FINDINGS: The ureters remain dilated into the pelvis and do change
caliber distally.  No definite point of obstruction is seen and
chronic reflux is a consideration.  The urinary bladder is
unremarkable.  The uterus is normal in size.  There is a large
cystic structure in the anterior right adnexa consistent with right
ovarian cyst of 41 x 58 x 49 no bony abnormality is seen. mm.
There is a small amount of free fluid in the pelvis.  In addition
there is diffuse strandiness of the soft tissues consistent with
fluid overload.  Scattered rectosigmoid colonic diverticula are
noted.  The right colon is decompressed and difficult to assess.
IMPRESSION: 1.  The ureters are dilated into the pelvis with no definite point
of obstruction.  Possible chronic reflux.
2.  41 x 58 x 49 cm right adnexal structure consistent with ovarian
cyst.
3.  Small amount of free fluid in the pelvis.
4.  Diffuse strandiness of the soft tissues consistent with fluid
overload.

## 2011-05-19 DIAGNOSIS — M1990 Unspecified osteoarthritis, unspecified site: Secondary | ICD-10-CM

## 2011-05-19 HISTORY — DX: Unspecified osteoarthritis, unspecified site: M19.90

## 2011-07-29 ENCOUNTER — Encounter (HOSPITAL_COMMUNITY): Payer: Self-pay | Admitting: *Deleted

## 2011-07-29 ENCOUNTER — Emergency Department (HOSPITAL_COMMUNITY)
Admission: EM | Admit: 2011-07-29 | Discharge: 2011-07-29 | Discharge status: Against Medical Advice | Payer: Medicaid Other | Attending: Emergency Medicine | Admitting: Emergency Medicine

## 2011-07-29 DIAGNOSIS — Z91199 Patient's noncompliance with other medical treatment and regimen due to unspecified reason: Secondary | ICD-10-CM | POA: Insufficient documentation

## 2011-07-29 DIAGNOSIS — R109 Unspecified abdominal pain: Secondary | ICD-10-CM | POA: Insufficient documentation

## 2011-07-29 DIAGNOSIS — E039 Hypothyroidism, unspecified: Secondary | ICD-10-CM | POA: Insufficient documentation

## 2011-07-29 DIAGNOSIS — Z9119 Patient's noncompliance with other medical treatment and regimen: Secondary | ICD-10-CM | POA: Insufficient documentation

## 2011-07-29 HISTORY — DX: Disorder of thyroid, unspecified: E07.9

## 2011-07-29 HISTORY — DX: Disorder of kidney and ureter, unspecified: N28.9

## 2011-07-29 NOTE — ED Notes (Signed)
Pt not answering call X2

## 2011-07-29 NOTE — ED Notes (Signed)
Pt signed AMA

## 2011-07-29 NOTE — ED Notes (Signed)
Called and no answer x2 

## 2011-07-29 NOTE — ED Notes (Signed)
Pt reports she started her menstrual cycle yesterday and has had severe abdominal pain. Reports large amount of clots being passed. Pt reports she also has hypothyroid and has not been taking medications for it.

## 2011-07-29 NOTE — ED Notes (Signed)
Called patient, and no answer.

## 2011-08-02 ENCOUNTER — Encounter (HOSPITAL_COMMUNITY): Payer: Self-pay | Admitting: Emergency Medicine

## 2011-08-02 ENCOUNTER — Inpatient Hospital Stay (HOSPITAL_COMMUNITY)
Admission: EM | Admit: 2011-08-02 | Discharge: 2011-08-06 | DRG: 812 | Disposition: A | Discharge status: Home / Self Care | Payer: Medicaid Other | Attending: Internal Medicine | Admitting: Internal Medicine

## 2011-08-02 DIAGNOSIS — E039 Hypothyroidism, unspecified: Secondary | ICD-10-CM | POA: Diagnosis present

## 2011-08-02 DIAGNOSIS — D649 Anemia, unspecified: Principal | ICD-10-CM | POA: Diagnosis present

## 2011-08-02 DIAGNOSIS — J45909 Unspecified asthma, uncomplicated: Secondary | ICD-10-CM | POA: Diagnosis present

## 2011-08-02 DIAGNOSIS — N730 Acute parametritis and pelvic cellulitis: Secondary | ICD-10-CM

## 2011-08-02 DIAGNOSIS — R109 Unspecified abdominal pain: Secondary | ICD-10-CM | POA: Diagnosis present

## 2011-08-02 DIAGNOSIS — N19 Unspecified kidney failure: Secondary | ICD-10-CM

## 2011-08-02 DIAGNOSIS — N189 Chronic kidney disease, unspecified: Secondary | ICD-10-CM | POA: Diagnosis present

## 2011-08-02 LAB — DIFFERENTIAL
Basophils Relative: 1 % (ref 0–1)
Eosinophils Relative: 1 % (ref 0–5)
Lymphs Abs: 0.8 10*3/uL (ref 0.7–4.0)
Monocytes Absolute: 0.3 10*3/uL (ref 0.1–1.0)

## 2011-08-02 LAB — BASIC METABOLIC PANEL
BUN: 10 mg/dL (ref 6–23)
CO2: 22 mEq/L (ref 19–32)
Chloride: 105 mEq/L (ref 96–112)
Creatinine, Ser: 2.57 mg/dL — ABNORMAL HIGH (ref 0.50–1.10)

## 2011-08-02 LAB — WET PREP, GENITAL: Yeast Wet Prep HPF POC: NONE SEEN

## 2011-08-02 LAB — CBC
Hemoglobin: 6.1 g/dL — CL (ref 12.0–15.0)
MCH: 21.4 pg — ABNORMAL LOW (ref 26.0–34.0)
MCV: 72.6 fL — ABNORMAL LOW (ref 78.0–100.0)
RBC: 2.85 MIL/uL — ABNORMAL LOW (ref 3.87–5.11)

## 2011-08-02 LAB — URINALYSIS, ROUTINE W REFLEX MICROSCOPIC
Bilirubin Urine: NEGATIVE
Glucose, UA: NEGATIVE mg/dL
Ketones, ur: NEGATIVE mg/dL
Leukocytes, UA: NEGATIVE
pH: 6.5 (ref 5.0–8.0)

## 2011-08-02 MED ORDER — DEXTROSE 5 % IV SOLN
1.0000 g | Freq: Once | INTRAVENOUS | Status: AC
  Administered 2011-08-03: 1 g via INTRAVENOUS
  Filled 2011-08-02: qty 10

## 2011-08-02 MED ORDER — KETOROLAC TROMETHAMINE 30 MG/ML IJ SOLN
30.0000 mg | Freq: Once | INTRAMUSCULAR | Status: AC
  Administered 2011-08-02: 30 mg via INTRAVENOUS
  Filled 2011-08-02: qty 1

## 2011-08-02 MED ORDER — ACETAMINOPHEN 80 MG/0.8ML PO SUSP: 500.0000 mg | Freq: Once | ORAL | Status: DC

## 2011-08-02 MED ORDER — SODIUM CHLORIDE 0.9 % IV BOLUS (SEPSIS)
1000.0000 mL | Freq: Once | INTRAVENOUS | Status: AC
  Administered 2011-08-02: 1000 mL via INTRAVENOUS

## 2011-08-02 MED ORDER — DOXYCYCLINE HYCLATE 100 MG PO TABS: 100.0000 mg | ORAL_TABLET | Freq: Once | ORAL | Status: DC

## 2011-08-02 MED ORDER — ONDANSETRON HCL 4 MG/2ML IJ SOLN
4.0000 mg | Freq: Once | INTRAMUSCULAR | Status: AC
  Administered 2011-08-02: 4 mg via INTRAVENOUS
  Filled 2011-08-02: qty 2

## 2011-08-02 NOTE — ED Provider Notes (Addendum)
History     CSN: [id number]  Arrival date & time 08/02/11  1744   First MD Initiated Contact with Patient 08/02/11 1938      Chief Complaint  Patient presents with  . Abdominal Pain  . Nausea    HPI This 36 year old female presents with multiple complaints.  She notes that over the past days to weeks she has been weak, with diffuse discomfort.  She also notes intermittent, though continuous vaginal bleeding.  He denies any syncope or chest pain, dyspnea.  The discomfort is greatest about her abdomen and back.  Described as a sore, throbbing.  Patient denies any fevers, though she is chilled.  Notably, the patient was seen at another facility 2 days ago.  She was told that she had dysfunctional uterine bleeding, and started on medroxyprogesterone.  She is taking 2 doses of this medication, and notes that her vaginal bleeding has decreased substantially.  She was also provided thyroid supplement.  She denies any new complaints, notes that her fatigue he continues. Past Medical History  Diagnosis Date  . Renal disorder   . Thyroid disease     Past Surgical History  Procedure Date  . Cholecystectomy   . Tubal ligation   . Thyriod radiation     No family history on file.  History  Substance Use Topics  . Smoking status: Never Smoker   . Smokeless tobacco: Not on file  . Alcohol Use: No    OB History    Grav Para Term Preterm Abortions TAB SAB Ect Mult Living                  Review of Systems  Constitutional:       HPI  HENT:       HPI otherwise negative  Eyes: Negative.   Respiratory:       HPI, otherwise negative  Cardiovascular:       HPI, otherwise nmegative  Gastrointestinal: Negative for vomiting.  Genitourinary: Positive for flank pain and vaginal bleeding. Negative for urgency, frequency, vaginal discharge, enuresis, vaginal pain and pelvic pain.       HPI, otherwise negative  Musculoskeletal:       HPI, otherwise negative  Skin: Negative.     Neurological: Negative for syncope.    Allergies  Codeine and Penicillins  Home Medications   Current Outpatient Rx  Name Route Sig Dispense Refill  . ALBUTEROL SULFATE HFA 108 (90 BASE) MCG/ACT IN AERS Inhalation Inhale 2 puffs into the lungs every 6 (six) hours as needed. For shortness of breath    . FERROUS SULFATE 325 (65 FE) MG PO TABS Oral Take 325 mg by mouth 2 (two) times daily. For ten days starting 3/15    . FLUTICASONE-SALMETEROL 100-50 MCG/DOSE IN AEPB Inhalation Inhale 1 puff into the lungs every 12 (twelve) hours.    Marland Kitchen LEVOTHYROXINE SODIUM 112 MCG PO TABS Oral Take 112 mcg by mouth daily.    Marland Kitchen MEDROXYPROGESTERONE ACETATE 10 MG PO TABS Oral Take 10 mg by mouth daily.      BP 125/58  Pulse 92  Temp(Src) 99.3 F (37.4 C) (Oral)  Resp 18  SpO2 100%  LMP 07/28/2011  Physical Exam  Nursing note and vitals reviewed. Constitutional: She appears well-developed and well-nourished. She appears listless. No distress.  HENT:  Head: Normocephalic and atraumatic.  Eyes: Conjunctivae and EOM are normal.  Cardiovascular: Normal rate.   Pulmonary/Chest: Effort normal. No stridor.  Abdominal: Soft. She exhibits no distension and  no mass. There is generalized tenderness. There is no rigidity, no rebound, no guarding, no tenderness at McBurney's point and negative Murphy's sign.  Genitourinary: Rectum normal. Pelvic exam was performed with patient supine. There is no tenderness, lesion or injury on the right labia. There is no tenderness, lesion or injury on the left labia. Cervix exhibits motion tenderness. Cervix exhibits no discharge and no friability. No signs of injury around the vagina. No vaginal discharge found.       trace blood in the vaginal vault.  Minor cmt. Os WNL  Neurological: She appears listless.  Skin: Skin is warm and dry.    ED Course  Procedures (including critical care time)  Labs Reviewed  CBC - Abnormal; Notable for the following:    WBC 2.3 (*)     RBC 2.85 (*)    Hemoglobin 6.1 (*)    HCT 20.7 (*)    MCV 72.6 (*)    MCH 21.4 (*)    MCHC 29.5 (*)    Platelets 117 (*) PLATELET COUNT CONFIRMED BY SMEAR   All other components within normal limits  DIFFERENTIAL - Abnormal; Notable for the following:    Neutro Abs 1.2 (*)    All other components within normal limits  BASIC METABOLIC PANEL - Abnormal; Notable for the following:    Creatinine, Ser 2.57 (*)    Calcium 8.3 (*)    GFR calc non Af Amer 23 (*)    GFR calc Af Amer 26 (*)    All other components within normal limits  URINALYSIS, ROUTINE W REFLEX MICROSCOPIC - Abnormal; Notable for the following:    Protein, ur 100 (*)    All other components within normal limits  URINE MICROSCOPIC-ADD ON - Abnormal; Notable for the following:    Squamous Epithelial / LPF FEW (*)    All other components within normal limits   No results found.   No diagnosis found.    MDM  This 36yo female presents with diffuse discomfort, listlessness and vaginal bleeding.  Notably, the patient has had prior w/u (three days ago) and was started on Medroxyprogesterone, Synthroid and not provided ongoing ABX.  On my exam the patient is in no distress, though she is uncomfortable.  She is trace blood in the vaginal vault, and mild cervical motion tenderness.  The patient's vital signs are unremarkable.  Given these findings, the anemia and renal dysfunction demonstrated on exam, this was provided antibiotics, transfused, and admitted for further evaluation and management.  I discussed the case with the obstetrician on call, Dr. Claiborne Billings, and the patient was admitted in stable condition for further evaluation of her anemia. Pelvic cultures are pending, as is a urine culture.  Dr. Claiborne Billings recommends that the patient be advised to followup with the faculty practice clinic upon discharge.       Gerhard Munch, MD 08/03/11 0004  Gerhard Munch, MD 08/03/11 0110

## 2011-08-02 NOTE — ED Notes (Signed)
Patient seen Thursday at another hosp and was told she had dysfunctional uterine bleeding as well as PID patient states she still have abdominal pain rates it 10/10 states continued bleeding and nausea. Patient requesting crackers.

## 2011-08-02 NOTE — ED Notes (Signed)
Pt reports abdominal pain radiating to back with nausea onset Wednesday. Pt reports seen at [facility] Wednesday and given prescriptions medication for hypothyroidism, dysfunctional bleeding and PID.

## 2011-08-03 ENCOUNTER — Encounter (HOSPITAL_COMMUNITY): Payer: Self-pay | Admitting: Internal Medicine

## 2011-08-03 ENCOUNTER — Inpatient Hospital Stay (HOSPITAL_COMMUNITY): Payer: Medicaid Other

## 2011-08-03 DIAGNOSIS — R109 Unspecified abdominal pain: Secondary | ICD-10-CM | POA: Diagnosis present

## 2011-08-03 DIAGNOSIS — E039 Hypothyroidism, unspecified: Secondary | ICD-10-CM | POA: Diagnosis present

## 2011-08-03 DIAGNOSIS — D649 Anemia, unspecified: Secondary | ICD-10-CM | POA: Diagnosis present

## 2011-08-03 DIAGNOSIS — J45909 Unspecified asthma, uncomplicated: Secondary | ICD-10-CM | POA: Diagnosis present

## 2011-08-03 LAB — COMPREHENSIVE METABOLIC PANEL
Albumin: 3.1 g/dL — ABNORMAL LOW (ref 3.5–5.2)
BUN: 11 mg/dL (ref 6–23)
Calcium: 8 mg/dL — ABNORMAL LOW (ref 8.4–10.5)
Creatinine, Ser: 2.7 mg/dL — ABNORMAL HIGH (ref 0.50–1.10)
GFR calc Af Amer: 25 mL/min — ABNORMAL LOW (ref >90)
Glucose, Bld: 87 mg/dL (ref 70–99)
Total Protein: 5.9 g/dL — ABNORMAL LOW (ref 6.0–8.3)

## 2011-08-03 LAB — CBC
HCT: 23.5 % — ABNORMAL LOW (ref 36.0–46.0)
Hemoglobin: 7.1 g/dL — ABNORMAL LOW (ref 12.0–15.0)
MCV: 74.8 fL — ABNORMAL LOW (ref 78.0–100.0)
RBC: 3.14 MIL/uL — ABNORMAL LOW (ref 3.87–5.11)
RDW: 16.6 % — ABNORMAL HIGH (ref 11.5–15.5)
WBC: 3 10*3/uL — ABNORMAL LOW (ref 4.0–10.5)

## 2011-08-03 LAB — GC/CHLAMYDIA PROBE AMP, GENITAL
Chlamydia, DNA Probe: NEGATIVE
GC Probe Amp, Genital: NEGATIVE

## 2011-08-03 LAB — PREPARE RBC (CROSSMATCH)

## 2011-08-03 MED ORDER — ONDANSETRON HCL 4 MG PO TABS: 4.0000 mg | ORAL_TABLET | Freq: Four times a day (QID) | ORAL | Status: DC | PRN

## 2011-08-03 MED ORDER — FERROUS SULFATE 325 (65 FE) MG PO TABS
325.0000 mg | ORAL_TABLET | Freq: Two times a day (BID) | ORAL | Status: DC
  Administered 2011-08-03 – 2011-08-06 (×7): 325 mg via ORAL
  Filled 2011-08-03 (×8): qty 1

## 2011-08-03 MED ORDER — LEVOTHYROXINE SODIUM 112 MCG PO TABS
112.0000 ug | ORAL_TABLET | Freq: Every day | ORAL | Status: DC
  Administered 2011-08-03 – 2011-08-06 (×4): 112 ug via ORAL
  Filled 2011-08-03 (×5): qty 1

## 2011-08-03 MED ORDER — DEXTROSE 5 % IV SOLN
2.0000 g | Freq: Two times a day (BID) | INTRAVENOUS | Status: DC
  Administered 2011-08-03 – 2011-08-06 (×7): 2 g via INTRAVENOUS
  Filled 2011-08-03 (×9): qty 2

## 2011-08-03 MED ORDER — ONDANSETRON HCL 4 MG/2ML IJ SOLN: 4.0000 mg | Freq: Four times a day (QID) | INTRAMUSCULAR | Status: DC | PRN

## 2011-08-03 MED ORDER — DOXYCYCLINE HYCLATE 100 MG IV SOLR
100.0000 mg | Freq: Two times a day (BID) | INTRAVENOUS | Status: DC
  Administered 2011-08-03 – 2011-08-04 (×3): 100 mg via INTRAVENOUS
  Filled 2011-08-03 (×4): qty 100

## 2011-08-03 MED ORDER — ACETAMINOPHEN 650 MG RE SUPP: 650.0000 mg | Freq: Four times a day (QID) | RECTAL | Status: DC | PRN

## 2011-08-03 MED ORDER — DOXYCYCLINE HYCLATE 100 MG IV SOLR
100.0000 mg | INTRAVENOUS | Status: AC
  Administered 2011-08-03: 100 mg via INTRAVENOUS
  Filled 2011-08-03: qty 100

## 2011-08-03 MED ORDER — FLUTICASONE-SALMETEROL 100-50 MCG/DOSE IN AEPB
1.0000 | INHALATION_SPRAY | Freq: Two times a day (BID) | RESPIRATORY_TRACT | Status: DC
  Administered 2011-08-03 – 2011-08-06 (×7): 1 via RESPIRATORY_TRACT
  Filled 2011-08-03: qty 14

## 2011-08-03 MED ORDER — SODIUM CHLORIDE 0.9 % IJ SOLN
3.0000 mL | Freq: Two times a day (BID) | INTRAMUSCULAR | Status: DC
  Administered 2011-08-03 (×3): 3 mL via INTRAVENOUS
  Administered 2011-08-04: 11:00:00 via INTRAVENOUS
  Administered 2011-08-05 – 2011-08-06 (×3): 3 mL via INTRAVENOUS

## 2011-08-03 MED ORDER — IOHEXOL 300 MG/ML  SOLN
20.0000 mL | INTRAMUSCULAR | Status: AC
  Administered 2011-08-03: 20 mL via ORAL

## 2011-08-03 MED ORDER — ALBUTEROL SULFATE HFA 108 (90 BASE) MCG/ACT IN AERS
2.0000 | INHALATION_SPRAY | Freq: Four times a day (QID) | RESPIRATORY_TRACT | Status: DC | PRN
  Filled 2011-08-03 (×2): qty 6.7

## 2011-08-03 MED ORDER — MENTHOL 3 MG MT LOZG
1.0000 | LOZENGE | OROMUCOSAL | Status: DC | PRN
  Administered 2011-08-04 – 2011-08-05 (×2): 3 mg via ORAL
  Filled 2011-08-03: qty 9

## 2011-08-03 MED ORDER — ACETAMINOPHEN 325 MG PO TABS
650.0000 mg | ORAL_TABLET | Freq: Four times a day (QID) | ORAL | Status: DC | PRN
  Administered 2011-08-05: 650 mg via ORAL
  Filled 2011-08-03: qty 2

## 2011-08-03 MED ORDER — SODIUM CHLORIDE 0.9 % IV SOLN
INTRAVENOUS | Status: DC
  Administered 2011-08-03: 20 mL/h via INTRAVENOUS

## 2011-08-03 NOTE — Progress Notes (Signed)
Received patient from ED via stretcher.Patient is admitted due to suprapubic abdominal pain,nausea and vomiting and was found to have a hgb of 6.1.Patient is alert,oriented x3,placed on telemetry box #41.Patient oriented to room and unit routines.Showed safety video.Call bell within reach. Myiesha Edgar Joselita,RN

## 2011-08-03 NOTE — Progress Notes (Signed)
I agree with the history and physical as per Dr. Cena Benton. Patient states that she has been in a monogamous relationship for the past 3 years. She states that she has more nausea and vomiting than anything else. She has no diarrhea and has had no sick contacts. She denies any specific travel history and is in the process now of getting second unit of packed red blood cells. Her wet prep is negative at present time. She's been seen by gynecology.  CT scan as well as an abdominal ultrasound done are pending.  If patient's pain does come under control next 24-48 hours patient could potentially be discharged with further management as per gynecologist, as this is more likely menstrual related pain. I do not see a urine culture ordered which could also be a cause for fever and nausea vomiting. She also does exhibit some left-sided flank pain. I will get a catheterized urine culture as well as a CBC and a be met in the a.m. antibiotics may be narrowed depending on this.  Pleas Koch, MD [facility] [phone]

## 2011-08-03 NOTE — H&P (Addendum)
Lori Murphy is an 36 y.o. female.   PCP - None.Used to be in [place]. Chief Complaint: Abdominal pain. HPI: 36 year-old female with history of post ablative hypothyroidism, asthma and chronic kidney disease with last creatinine 2.2 presented to the ER because of abdominal pain. Patient has been having them down pain since last Tuesday as almost a week ago and she had gone to the ER at [facility] 2 days later and was told that she had PID. She was given IV antibiotic one dose and 1 g of azithromycin and told to follow with PCP or return to the ER if there is any worsening symptoms. In addition patient also was given medroxyprogesterone because patient is having increased menstrual cycle with clots. Patient came to the ER yesterday at [facility] because of persistent abdominal pain which is suprapubic with fever chills and nausea vomiting and unable to keep anything. In the ER patient was found to have anemia. The ER physician did a pelvic exam and did find cervical tenderness. Gynecologist on call Dr. Claiborne Billings was consulted by ER patient Dr. Jeraldine Loots. At this time they recommended PRBC transfusion and outpatient followup. And thus hospitalist was called for admission for anemia. Patient denies any chest pain short breath dizziness or loss of consciousness. On my exam patient does have diffuse tenderness in the abdomen.   Past Medical History  Diagnosis Date  . Renal disorder   . Thyroid disease     Past Surgical History  Procedure Date  . Cholecystectomy   . Tubal ligation   . Thyriod radiation     History reviewed. No pertinent family history. Social History:  reports that she has quit smoking. She does not have any smokeless tobacco history on file. She reports that she does not drink alcohol or use illicit drugs.  Allergies:  Allergies  Allergen Reactions  . Codeine Anaphylaxis and Swelling  . Penicillins Anaphylaxis and Swelling    Medications Prior to Admission    Medication Dose Route Frequency Provider Last Rate Last Dose  . acetaminophen (TYLENOL) 80 MG/0.8ML suspension 500 mg  500 mg Oral Once Gerhard Munch, MD      . cefTRIAXone (ROCEPHIN) 1 g in dextrose 5 % 50 mL IVPB  1 g Intravenous Once Gerhard Munch, MD      . doxycycline (VIBRAMYCIN) 100 mg in dextrose 5 % 250 mL IVPB  100 mg Intravenous To Minor Gerhard Munch, MD      . ketorolac (TORADOL) 30 MG/ML injection 30 mg  30 mg Intravenous Once Gerhard Munch, MD   30 mg at 08/02/11 2044  . ondansetron (ZOFRAN) injection 4 mg  4 mg Intravenous Once Gerhard Munch, MD   4 mg at 08/02/11 2044  . sodium chloride 0.9 % bolus 1,000 mL  1,000 mL Intravenous Once Gerhard Munch, MD   1,000 mL at 08/02/11 2045  . DISCONTD: doxycycline (VIBRA-TABS) tablet 100 mg  100 mg Oral Once Gerhard Munch, MD       Medications Prior to Admission  Medication Sig Dispense Refill  . albuterol (PROVENTIL HFA;VENTOLIN HFA) 108 (90 BASE) MCG/ACT inhaler Inhale 2 puffs into the lungs every 6 (six) hours as needed. For shortness of breath      . Fluticasone-Salmeterol (ADVAIR) 100-50 MCG/DOSE AEPB Inhale 1 puff into the lungs every 12 (twelve) hours.        Results for orders placed during the hospital encounter of 08/02/11 (from the past 48 hour(s))  URINALYSIS, ROUTINE W REFLEX MICROSCOPIC  Status: Abnormal   Collection Time   08/02/11  8:34 PM      Component Value Range Comment   Color, Urine YELLOW  YELLOW     APPearance CLEAR  CLEAR     Specific Gravity, Urine 1.012  1.005 - 1.030     pH 6.5  5.0 - 8.0     Glucose, UA NEGATIVE  NEGATIVE (mg/dL)    Hgb urine dipstick NEGATIVE  NEGATIVE     Bilirubin Urine NEGATIVE  NEGATIVE     Ketones, ur NEGATIVE  NEGATIVE (mg/dL)    Protein, ur 161 (*) NEGATIVE (mg/dL)    Urobilinogen, UA 0.2  0.0 - 1.0 (mg/dL)    Nitrite NEGATIVE  NEGATIVE     Leukocytes, UA NEGATIVE  NEGATIVE    URINE MICROSCOPIC-ADD ON     Status: Abnormal   Collection Time   08/02/11  8:34  PM      Component Value Range Comment   Squamous Epithelial / LPF FEW (*) RARE     WBC, UA 0-2  <3 (WBC/hpf)   CBC     Status: Abnormal   Collection Time   08/02/11  8:51 PM      Component Value Range Comment   WBC 2.3 (*) 4.0 - 10.5 (K/uL)    RBC 2.85 (*) 3.87 - 5.11 (MIL/uL)    Hemoglobin 6.1 (*) 12.0 - 15.0 (g/dL)    HCT 09.6 (*) 04.5 - 46.0 (%)    MCV 72.6 (*) 78.0 - 100.0 (fL)    MCH 21.4 (*) 26.0 - 34.0 (pg)    MCHC 29.5 (*) 30.0 - 36.0 (g/dL)    RDW 40.9  81.1 - 91.4 (%)    Platelets 117 (*) 150 - 400 (K/uL) PLATELET COUNT CONFIRMED BY SMEAR  DIFFERENTIAL     Status: Abnormal   Collection Time   08/02/11  8:51 PM      Component Value Range Comment   Neutrophils Relative 54  43 - 77 (%)    Lymphocytes Relative 33  12 - 46 (%)    Monocytes Relative 11  3 - 12 (%)    Eosinophils Relative 1  0 - 5 (%)    Basophils Relative 1  0 - 1 (%)    Neutro Abs 1.2 (*) 1.7 - 7.7 (K/uL)    Lymphs Abs 0.8  0.7 - 4.0 (K/uL)    Monocytes Absolute 0.3  0.1 - 1.0 (K/uL)    Eosinophils Absolute 0.0  0.0 - 0.7 (K/uL)    Basophils Absolute 0.0  0.0 - 0.1 (K/uL)    RBC Morphology POLYCHROMASIA PRESENT      Smear Review LARGE PLATELETS PRESENT     BASIC METABOLIC PANEL     Status: Abnormal   Collection Time   08/02/11  8:51 PM      Component Value Range Comment   Sodium 137  135 - 145 (mEq/L)    Potassium 3.7  3.5 - 5.1 (mEq/L)    Chloride 105  96 - 112 (mEq/L)    CO2 22  19 - 32 (mEq/L)    Glucose, Bld 85  70 - 99 (mg/dL)    BUN 10  6 - 23 (mg/dL)    Creatinine, Ser 7.82 (*) 0.50 - 1.10 (mg/dL)    Calcium 8.3 (*) 8.4 - 10.5 (mg/dL)    GFR calc non Af Amer 23 (*) >90 (mL/min)    GFR calc Af Amer 26 (*) >90 (mL/min)   WET PREP, GENITAL  Status: Normal   Collection Time   08/02/11 11:48 PM      Component Value Range Comment   Yeast Wet Prep HPF POC NONE SEEN  NONE SEEN     Trich, Wet Prep NONE SEEN  NONE SEEN     Clue Cells Wet Prep HPF POC NONE SEEN  NONE SEEN     WBC, Wet Prep HPF  POC NONE SEEN  NONE SEEN    POCT PREGNANCY, URINE     Status: Normal   Collection Time   08/03/11 12:19 AM      Component Value Range Comment   Preg Test, Ur NEGATIVE  NEGATIVE     No results found.  Review of Systems  Constitutional: Positive for fever and chills.  HENT: Negative.   Eyes: Negative.   Respiratory: Negative.   Cardiovascular: Negative.   Gastrointestinal: Positive for nausea, vomiting and abdominal pain.  Genitourinary:       Bleeding.  Musculoskeletal: Negative.   Skin: Negative.   Neurological: Negative.   Endo/Heme/Allergies: Negative.   Psychiatric/Behavioral: Negative.     Blood pressure 99/58, pulse 118, temperature 98.9 F (37.2 C), temperature source Oral, resp. rate 18, last menstrual period 07/28/2011, SpO2 98.00%. Physical Exam  Constitutional: She is oriented to person, place, and time. She appears well-developed and well-nourished. No distress.  HENT:  Head: Normocephalic and atraumatic.  Right Ear: External ear normal.  Eyes: Conjunctivae are normal. Pupils are equal, round, and reactive to light.  Neck: Normal range of motion. Neck supple.  Cardiovascular: Normal rate and regular rhythm.   Respiratory: Effort normal and breath sounds normal. No respiratory distress. She has no wheezes. She has no rales.  GI: Soft. Bowel sounds are normal. There is tenderness. There is no rebound and no guarding.  Musculoskeletal: Normal range of motion. She exhibits no edema and no tenderness.  Neurological: She is alert and oriented to person, place, and time.       Moves all extremities.  Skin: Skin is warm and dry. No rash noted. She is not diaphoretic. No erythema.  Psychiatric: Her behavior is normal.     Assessment/Plan #1. Anemia - most likely from her dysfunctional uterine bleeding and also probable contribution from chronic kidney disease. At this time ER physician as already ordered 2 units of packed red blood cells transfusion and we will recheck  CBC in a.m. Closely following patient's blood works patient does have pancytopenia. This has been there earlier. We need to closely follow her white blood cell count and platelets in addition to hemoglobin. May need outpatient followup with hematologist. #2. Abdominal pain most likely from PID - since patient is having persistent nausea and vomiting patient will need IV antibiotics. I have ordered Cefotetan and doxycycline. I have reconsulted Dr. Claiborne Billings gynecologist on call to evaluate this patient. At this time I have ordered CT abdomen and pelvis without contrast to check for any other cause of abdominal pain and also pelvic ultrasound. #3. Hypothyroidism after radioactive iodine ablation for Graves' disease - patient has not been taking her Synthroid for last 4 months as she didn't get refills. A TSH to may be high and she was just restarted on Synthroid 3 days ago at [facility]. I have ordered TSH to check the degree of hypothyroidism. #4. History of asthma presently stable so continue present medications. #5.CKD - baseline creatinine is around 2.2 it is presently 2.5 which may improve her for transfusion. Closely follow metabolic panel.  CODE STATUS - full code.  Kashmir Lysaght N. 08/03/2011, 1:01 AM

## 2011-08-03 NOTE — H&P (Signed)
36 y.o. G6P6 with multiple complaints of N/V, fevers/chills and pelvic/abd pain presented to ER last night.  Pt was seen at [facility] Tuesday for pain associated with beginning her menstrual cycle.  She was dx with PID, and was given one dose of IV abx and 1g azithromycin po and with finding of low Hb was also started on Provera 10mg  for a 10d course to control vaginal bleeding until she could follow up outpt.  I spoke with charge RN at [facility] and confirmed that pt's GC/CT obtained Tuesday were negative. Pt states the pain she has been having over the last several days is similar but stronger to pain she has experienced with cycles in the past.  Pt states she used to have regular cycles but cycles became irregular around the time of dx of Grave's Ds in 2010.  She states due to insurance issues she is not compliant with thyroid medication and has not taken it in several months.  She states that in Jan she bleed for 2 out of 4 wks, heavily.  She had no bleeding in February and bled for the first time in March this Tuesday.  She had bleeding Tues and Wed, but not very heavy and it has mainly subsided since then.  Pt has 1 partner x 3 yrs and denies any hx of STDs, although was treated presumptively in 2005 for PID and had IUD removed.    Past Medical History  Diagnosis Date  . Renal disorder   . Thyroid disease    Past Surgical History  Procedure Date  . Cholecystectomy   . Tubal ligation   . Thyriod radiation     History   Social History  . Marital Status: Single    Spouse Name: N/A    Number of Children: N/A  . Years of Education: N/A   Occupational History  . Not on file.   Social History Main Topics  . Smoking status: Former Games developer  . Smokeless tobacco: Not on file  . Alcohol Use: No  . Drug Use: No  . Sexually Active:    Other Topics Concern  . Not on file   Social History Narrative  . No narrative on file    No current facility-administered medications on file  prior to encounter.   Current Outpatient Prescriptions on File Prior to Encounter  Medication Sig Dispense Refill  . albuterol (PROVENTIL HFA;VENTOLIN HFA) 108 (90 BASE) MCG/ACT inhaler Inhale 2 puffs into the lungs every 6 (six) hours as needed. For shortness of breath      . Fluticasone-Salmeterol (ADVAIR) 100-50 MCG/DOSE AEPB Inhale 1 puff into the lungs every 12 (twelve) hours.        Allergies  Allergen Reactions  . Codeine Anaphylaxis and Swelling  . Penicillins Anaphylaxis and Swelling    @VITALS2 @  Lungs: clear to ascultation Cor:  RRR Abdomen:  soft, diffusely tender in all quadrants, worse in bilateral lower quadrants near inguinal ligament Ex:  no cords, erythema Pelvic:  On bimanual exam, severe pain with gentle pressure at introitus referred to lower abd.  Once relaxed, No CMT with gentle movement.  No bleeding noted.  A/P:  Pelvic pain dx as PID, possible TOA 1)  Given GC/CT neg, wet mount neg, and no prurulent d/c noted on exam, PID less likely but would continue IV abx therapy until clinically improved, pt should complete 14 day course of doxycycline.  If TOA found on US/CT Flagyl should be added to regimen. 2) DUB - can  continue Provera 10mg  to complete 10day course, but would taper dose of final days of tx.  Goal to prevent continued bleeding until had be evaluated and tx outpt.  Reviewed with pt possible reasons for DUB and options for evaluation and treatment.           Philip Aspen

## 2011-08-03 NOTE — Progress Notes (Addendum)
I reviewed CT and [place] of abd/pelvis.  No TOA found.  Possible polyp within endometrium.  Pt should have further evaluation and treatment as outpt.  This could be done by [facility] hysteroscopy or with combination of [facility] and EMB.  Please contract me with any further questions/concerns.  I've spoken with Dr. Debroah Loop who is part of the faculty practice at [facility].  They are able to see uninsured patients and are happy to get her in to their clinic.  I've forwarded Graci's info to Mayra Neer at the Ob/Gyn clinic so they will be aware of her.  Please pass on to the patient the clinic's # so she can schedule f/u: [phone].  Please let her know that insurance issues will not be a problem.

## 2011-08-04 ENCOUNTER — Inpatient Hospital Stay (HOSPITAL_COMMUNITY): Payer: Medicaid Other

## 2011-08-04 LAB — RETICULOCYTES
RBC.: 3.67 MIL/uL — ABNORMAL LOW (ref 3.87–5.11)
Retic Count, Absolute: 33 10*3/uL (ref 19.0–186.0)
Retic Ct Pct: 0.9 % (ref 0.4–3.1)

## 2011-08-04 LAB — IRON AND TIBC: Saturation Ratios: 14 % — ABNORMAL LOW (ref 20–55)

## 2011-08-04 LAB — CBC
MCH: 23.6 pg — ABNORMAL LOW (ref 26.0–34.0)
MCHC: 31.2 g/dL (ref 30.0–36.0)
MCV: 75.8 fL — ABNORMAL LOW (ref 78.0–100.0)
Platelets: 118 10*3/uL — ABNORMAL LOW (ref 150–400)
RBC: 3.51 MIL/uL — ABNORMAL LOW (ref 3.87–5.11)

## 2011-08-04 LAB — VITAMIN B12: Vitamin B-12: 567 pg/mL (ref 211–911)

## 2011-08-04 LAB — TYPE AND SCREEN: ABO/RH(D): O NEG

## 2011-08-04 LAB — BASIC METABOLIC PANEL
CO2: 24 mEq/L (ref 19–32)
Calcium: 8 mg/dL — ABNORMAL LOW (ref 8.4–10.5)
Creatinine, Ser: 2.6 mg/dL — ABNORMAL HIGH (ref 0.50–1.10)
GFR calc non Af Amer: 23 mL/min — ABNORMAL LOW (ref >90)

## 2011-08-04 LAB — COMPREHENSIVE METABOLIC PANEL
ALT: 11 U/L (ref 0–35)
AST: 28 U/L (ref 0–37)
CO2: 23 mEq/L (ref 19–32)
Calcium: 8.3 mg/dL — ABNORMAL LOW (ref 8.4–10.5)
GFR calc non Af Amer: 25 mL/min — ABNORMAL LOW (ref >90)
Sodium: 137 mEq/L (ref 135–145)

## 2011-08-04 MED ORDER — DOXYCYCLINE HYCLATE 100 MG PO TABS
100.0000 mg | ORAL_TABLET | Freq: Two times a day (BID) | ORAL | Status: DC
  Administered 2011-08-04 – 2011-08-06 (×4): 100 mg via ORAL
  Filled 2011-08-04 (×5): qty 1

## 2011-08-04 NOTE — Progress Notes (Signed)
Subjective: No complaints denies any chest pain or shortness of breath  Objective: Vital signs in last 24 hours: Filed Vitals:   08/03/11 2302 08/04/11 0523 08/04/11 1018 08/04/11 1328  BP: 97/55 105/60 106/66 111/66  Pulse: 82 76 85 93  Temp: 99.2 F (37.3 C) 99.1 F (37.3 C) 98.5 F (36.9 C) 99.2 F (37.3 C)  TempSrc: Oral Oral Oral Oral  Resp: 18 18 19 21   Height:  5\' 2"  (1.575 m)    Weight:  83.7 kg (184 lb 8.4 oz)    SpO2: 96% 98% 95% 100%    Intake/Output Summary (Last 24 hours) at 08/04/11 1715 Last data filed at 08/04/11 1300  Gross per 24 hour  Intake    620 ml  Output   2850 ml  Net  -2230 ml    Weight change: 0 kg (0 lb)  Constitutional: She is oriented to person, place, and time. She appears well-developed and well-nourished. No distress.  HENT:  Head: Normocephalic and atraumatic.  Right Ear: External ear normal.  Eyes: Conjunctivae are normal. Pupils are equal, round, and reactive to light.  Neck: Normal range of motion. Neck supple.  Cardiovascular: Normal rate and regular rhythm.  Respiratory: Effort normal and breath sounds normal. No respiratory distress. She has no wheezes. She has no rales.  GI: Soft. Bowel sounds are normal. There is tenderness. There is no rebound and no guarding.  Musculoskeletal: Normal range of motion. She exhibits no edema and no tenderness.  Neurological: She is alert and oriented to person, place, and time.  Moves all extremities.  Skin: Skin is warm and dry. No rash noted. She is not diaphoretic. No erythema.  Psychiatric: Her behavior is normal.    Lab Results: Results for orders placed during the hospital encounter of 08/02/11 (from the past 24 hour(s))  CBC     Status: Abnormal   Collection Time   08/04/11  5:00 AM      Component Value Range   WBC 2.6 (*) 4.0 - 10.5 (K/uL)   RBC 3.51 (*) 3.87 - 5.11 (MIL/uL)   Hemoglobin 8.3 (*) 12.0 - 15.0 (g/dL)   HCT 16.1 (*) 09.6 - 46.0 (%)   MCV 75.8 (*) 78.0 - 100.0 (fL)   MCH 23.6 (*) 26.0 - 34.0 (pg)   MCHC 31.2  30.0 - 36.0 (g/dL)   RDW 04.5 (*) 40.9 - 15.5 (%)   Platelets 118 (*) 150 - 400 (K/uL)  BASIC METABOLIC PANEL     Status: Abnormal   Collection Time   08/04/11  5:00 AM      Component Value Range   Sodium 140  135 - 145 (mEq/L)   Potassium 3.7  3.5 - 5.1 (mEq/L)   Chloride 108  96 - 112 (mEq/L)   CO2 24  19 - 32 (mEq/L)   Glucose, Bld 77  70 - 99 (mg/dL)   BUN 10  6 - 23 (mg/dL)   Creatinine, Ser 8.11 (*) 0.50 - 1.10 (mg/dL)   Calcium 8.0 (*) 8.4 - 10.5 (mg/dL)   GFR calc non Af Amer 23 (*) >90 (mL/min)   GFR calc Af Amer 26 (*) >90 (mL/min)     Micro: Recent Results (from the past 240 hour(s))  WET PREP, GENITAL     Status: Normal   Collection Time   08/02/11 11:48 PM      Component Value Range Status Comment   Yeast Wet Prep HPF POC NONE SEEN  NONE SEEN  Final  Trich, Wet Prep NONE SEEN  NONE SEEN  Final    Clue Cells Wet Prep HPF POC NONE SEEN  NONE SEEN  Final    WBC, Wet Prep HPF POC NONE SEEN  NONE SEEN  Final   URINE CULTURE     Status: Normal (Preliminary result)   Collection Time   08/03/11 11:28 AM      Component Value Range Status Comment   Specimen Description URINE, CLEAN CATCH   Final    Special Requests NONE   Final    Culture  Setup Time [id number]   Final    Colony Count PENDING   Incomplete    Culture Culture reincubated for better growth   Final    Report Status PENDING   Incomplete     Studies/Results: Ct Abdomen Pelvis Wo Contrast  08/03/2011  *RADIOLOGY REPORT*  Clinical Data: Abdominal pain.  Evaluate for potential tubo-ovarian abscess.  CT ABDOMEN AND PELVIS WITHOUT CONTRAST  Technique:  Multidetector CT imaging of the abdomen and pelvis was performed following the standard protocol without intravenous contrast.  Comparison: Pelvic ultrasound dated 08/03/2011.  CT of the abdomen and pelvis dated 05/24/2010.  Findings:  Lung Bases: A small amount of pleural thickening over the lateral aspect of the left  lower lobe. Small hiatal hernia with mild circumferential thickening of the distal esophagus.  Otherwise, unremarkable.  Abdomen/Pelvis:  Status post cholecystectomy.  The unenhanced appearance of the liver, pancreas, spleen and bilateral adrenal glands is unremarkable.  The right kidney appears atrophic with multiple regions of parenchymal scarring.  Left kidney has an irregular contour, with at least one exophytic isoattenuating lesion measuring 1.7 cm (image 26 of series 2), that could represent a solid neoplasm.  The there is also some probable scarring in the upper pole of the left kidney.  All of these findings are poorly evaluated without IV contrast.  No abnormal calcifications within the collecting system of either kidney, along the course of either ureter, or within the lumen of the urinary bladder.  No hydroureteronephrosis or perinephric stranding at this time.  There is a trace volume of free fluid in the cul-de-sac, likely physiologic in this young female. No pneumoperitoneum. No pathologic distension of bowel.  There appears be some fluid within the endometrial canal.  The uterus and ovaries are otherwise unremarkable in appearance.  On this noncontrast CT scan, no definite signs to suggest tubo-ovarian abscess are appreciated. The patient is status post bilateral tubal ligation.  Urinary bladder is unremarkable in appearance.  Musculoskeletal: There are no aggressive appearing lytic or blastic lesions noted in the visualized portions of the skeleton.  IMPRESSION: 1.  No findings to suggest the presence of a tubo-ovarian abscess on today's noncontrast CT examination. 2.  Small volume of free fluid in the cul-de-sac is presumably physiologic in this young female. 3.  Status post bilateral tubal ligation and cholecystectomy. 4.  There appears to be extensive parenchymal scarring in the right kidney, likely related to prior episodes of infection/infarction. Additionally, there is likely some scarring in  the upper pole of the left kidney, and there is a 1.7 cm soft tissue attenuation lesion extending exophytically from the interpolar region of the left kidney that could represent a small solid neoplasm.  However, this lesion is unchanged in size and appearance compared to prior study from 05/24/2010 such that if this is a solid neoplasm, it may represent a benign lesion such as an oncocytoma.  This can be further characterized with renal  ultrasound if clinically indicated. 5.  Small hiatal hernia with mild circumferential thickening of the distal esophagus.  These findings could suggest reflux esophagitis.  Original Report Authenticated By: Florencia Reasons, M.D.   US Transvaginal Non-ob  08/03/2011  *RADIOLOGY REPORT*  Clinical Data: Abdominal pain.  Concern for tubo-ovarian abscess  TRANSABDOMINAL AND TRANSVAGINAL ULTRASOUND OF PELVIS Technique:  Both transabdominal and transvaginal ultrasound examinations of the pelvis were performed. Transabdominal technique was performed for global imaging of the pelvis including uterus, ovaries, adnexal regions, and pelvic cul-de-sac.  Comparison: CT same date   It was necessary to proceed with endovaginal exam following the transabdominal exam to visualize the endometrium.  Findings:  Uterus: Anteverted, anteflexed.  10.5 x 6.6 x 6.0 cm.  No focal myometrial abnormality.  Endometrium: Distended by fluid/debris, with endometrial canal echogenic filling defect measuring 1.7 cm maximally.  Adjacent endometrial thickness measures 4 mm.  Right ovary:  3.2 x 1.5 x 1.0 cm.  Normal.  Left ovary: 2.4 x 1.5 x 1.3 cm.  Normal.  Other findings: Trace free fluid noted in the cul-de-sac.  IMPRESSION: Distention of the endometrial canal body fluid/debris, with echogenic mass like filling defect, most likely polyp, although endometrial hyperplasia or neoplasia could have a similar appearance. Endometritis is not excluded, if there has been recent instrumentation. Hysteroscopic  evaluation is recommended with sampling directed towards the anterior endometrial canal.  The ovaries are normal bilaterally without evidence of tubal ovarian abscess.  Original Report Authenticated By: Harrel Lemon, M.D.   US Pelvis Complete  08/03/2011  *RADIOLOGY REPORT*  Clinical Data: Abdominal pain.  Concern for tubo-ovarian abscess  TRANSABDOMINAL AND TRANSVAGINAL ULTRASOUND OF PELVIS Technique:  Both transabdominal and transvaginal ultrasound examinations of the pelvis were performed. Transabdominal technique was performed for global imaging of the pelvis including uterus, ovaries, adnexal regions, and pelvic cul-de-sac.  Comparison: CT same date   It was necessary to proceed with endovaginal exam following the transabdominal exam to visualize the endometrium.  Findings:  Uterus: Anteverted, anteflexed.  10.5 x 6.6 x 6.0 cm.  No focal myometrial abnormality.  Endometrium: Distended by fluid/debris, with endometrial canal echogenic filling defect measuring 1.7 cm maximally.  Adjacent endometrial thickness measures 4 mm.  Right ovary:  3.2 x 1.5 x 1.0 cm.  Normal.  Left ovary: 2.4 x 1.5 x 1.3 cm.  Normal.  Other findings: Trace free fluid noted in the cul-de-sac.  IMPRESSION: Distention of the endometrial canal body fluid/debris, with echogenic mass like filling defect, most likely polyp, although endometrial hyperplasia or neoplasia could have a similar appearance. Endometritis is not excluded, if there has been recent instrumentation. Hysteroscopic evaluation is recommended with sampling directed towards the anterior endometrial canal.  The ovaries are normal bilaterally without evidence of tubal ovarian abscess.  Original Report Authenticated By: Harrel Lemon, M.D.    Medications:  Scheduled Meds:   . cefoTEtan (CEFOTAN) IV  2 g Intravenous Q12H  . doxycycline (VIBRAMYCIN) IV  100 mg Intravenous Q12H  . ferrous sulfate  325 mg Oral BID  . Fluticasone-Salmeterol  1 puff Inhalation Q12H    . levothyroxine  112 mcg Oral Q0600  . sodium chloride  3 mL Intravenous Q12H   Continuous Infusions:   . sodium chloride 20 mL/hr (08/03/11 0245)   PRN Meds:.acetaminophen, acetaminophen, albuterol, menthol-cetylpyridinium, ondansetron (ZOFRAN) IV, ondansetron   Assessment: Active Problems:  Abdominal pain  Anemia  Hypothyroidism  Asthma   Plan: #1. Anemia - most likely from her dysfunctional uterine bleeding and  also probable contribution from chronic kidney disease. Status post 2 units of packed red blood cells transfusion and we will recheck CBC in a.m. Closely following patient's blood works patient does have pancytopenia. Anemia panel. Outpatient hematology followup.  #2. Abdominal pain doubt PID, Chlamydia/and oriented negative -  PID less likely but would continue IV abx therapy until clinically improved,complete 14 day course of doxycycline  . I have reconsulted Dr. Claiborne Billings gynecologist on call to evaluate this patient. Kindly see her note from 08/03/2011 at 5:48 PM . Pelvic ultrasound and CT of the abdomen and pelvis has been done. She will need outpatient gynecologic followup full  #3. Hypothyroidism after radioactive iodine ablation for Graves' disease - patient has not been taking her Synthroid for last 4 months as she didn't get refills. A TSH to may be high and she was just restarted on Synthroid 3 days ago at [facility]. I have ordered TSH to check the degree of hypothyroidism.   #4. History of asthma presently stable so continue present medications.   #5.CKD - baseline creatinine is around 2.2 it is presently 2.5 which may improve her for transfusion. Closely follow metabolic panel.  #6 questionable renal mass Will do a renal ultrasound.   Disposition if workup is negative and HG stable , discharge home in the morning    LOS: 2 days   [facility] 08/04/2011, 5:15 PM

## 2011-08-04 NOTE — Progress Notes (Signed)
Utilization review completed.  

## 2011-08-05 ENCOUNTER — Inpatient Hospital Stay (HOSPITAL_COMMUNITY): Payer: Medicaid Other

## 2011-08-05 LAB — CBC
MCH: 23.1 pg — ABNORMAL LOW (ref 26.0–34.0)
Platelets: 109 10*3/uL — ABNORMAL LOW (ref 150–400)
RBC: 3.6 MIL/uL — ABNORMAL LOW (ref 3.87–5.11)
WBC: 2.7 10*3/uL — ABNORMAL LOW (ref 4.0–10.5)

## 2011-08-05 MED ORDER — MEDROXYPROGESTERONE ACETATE 10 MG PO TABS
10.0000 mg | ORAL_TABLET | Freq: Every day | ORAL | Status: DC
  Administered 2011-08-05 – 2011-08-06 (×2): 10 mg via ORAL
  Filled 2011-08-05 (×2): qty 1

## 2011-08-05 NOTE — Progress Notes (Signed)
Subjective:   Cough with intermittent yellow sputum. Some chills and low-grade fevers. Starting to have some vaginal bleeding. Denies abdominal pain.  Objective  Vital signs in last 24 hours: Filed Vitals:   08/05/11 0449 08/05/11 1001 08/05/11 1357 08/05/11 1430  BP: 105/62 95/45  92/40  Pulse: 86 94  97  Temp: 100.2 F (37.9 C) 99.3 F (37.4 C) 100.8 F (38.2 C) 100.2 F (37.9 C)  TempSrc: Oral Oral Oral Oral  Resp: 20 18  18   Height:      Weight:      SpO2: 90% 95%  92%   Weight change: -0.3 kg (-10.6 oz)  Intake/Output Summary (Last 24 hours) at 08/05/11 1718 Last data filed at 08/05/11 1300  Gross per 24 hour  Intake    480 ml  Output   2050 ml  Net  -1570 ml    Physical Exam:  General Exam: Comfortable.  Respiratory System: Fair breath sounds bilaterally with occasional rhonchi.. No increased work of breathing.  Cardiovascular System: First and second heart sounds heard. Regular rate and rhythm. No JVD/murmurs.  Gastrointestinal System: Abdomen is non distended, soft and normal bowel sounds heard.  Central Nervous System: Alert and oriented. No focal neurological deficits. Extremities: Symmetrical 5 x 5 power. No pedal edema.   Labs:  Basic Metabolic Panel:  Lab 08/04/11 1610 08/04/11 0500 08/03/11 0844  NA 137 140 140  K 3.8 3.7 4.0  CL 105 108 108  CO2 23 24 24   GLUCOSE 84 77 87  BUN 9 10 11   CREATININE 2.35* 2.60* 2.70*  CALCIUM 8.3* 8.0* 8.0*  ALB -- -- --  PHOS -- -- --   Liver Function Tests:  Lab 08/04/11 1735 08/03/11 0844  AST 28 21  ALT 11 10  ALKPHOS 36* 33*  BILITOT 0.2* 0.5  PROT 6.4 5.9*  ALBUMIN 3.3* 3.1*   No results found for this basename: LIPASE:3,AMYLASE:3 in the last 168 hours No results found for this basename: AMMONIA:3 in the last 168 hours CBC:  Lab 08/05/11 0635 08/04/11 0500 08/03/11 0844 08/02/11 2051  WBC 2.7* 2.6* 3.0* --  NEUTROABS -- -- -- 1.2*  HGB 8.3* 8.3* 7.1* --  HCT 27.0* 26.6* 23.5* --  MCV 75.0*  75.8* 74.8* 72.6*  PLT 109* 118* 127* --   Cardiac Enzymes: No results found for this basename: CKTOTAL:5,CKMB:5,CKMBINDEX:5,TROPONINI:5 in the last 168 hours CBG: No results found for this basename: GLUCAP:5 in the last 168 hours  Iron Studies:   Basename 08/04/11 1735  IRON 60  TIBC 420  TRANSFERRIN --  FERRITIN 75   Studies/Results: US Renal  08/04/2011  *RADIOLOGY REPORT*  Clinical Data: Abnormal CT exam with question mass versus cyst of left kidney  RENAL/URINARY TRACT ULTRASOUND COMPLETE  Comparison:  01/11/2010 Correlation:  CT abdomen 08/03/2011  Findings:  Right Kidney:  7.9 cm length.  Small and atrophic in appearance. Increased cortical echogenicity.  No gross mass or hydronephrosis. No shadowing calcification.  Left Kidney:  10.6 cm length.  Upper normal cortical echogenicity. Hypoechoic nodule at upper pole left kidney medially, 1.5 x 1.3 x 2.1 cm, corresponding to a poorly defined low attenuation lesion at this site on CT.  The exophytic nodule identified at the lateral aspect of the mid left kidney on prior CT is not sonographically identified. No additional renal mass, hydronephrosis, or shadowing calcification.  Bladder:  Partially distended, morphologically normal appearance. Ureteral jets were not visualized.  IMPRESSION: Atrophic scarred right kidney. Tiny cyst upper pole  left kidney. Exophytic nodule identified at the lateral aspect of the mid left kidney prior CT is not sonographically localized. It is uncertain whether this represents lobulated renal parenchyma or an exophytic solid or cystic lesion. In light of patient's renal dysfunction, recommend noncontrast MR imaging of the abdomen to determine if a solid or cystic lesion is present at the lateral aspect of the mid left kidney.  Original Report Authenticated By: Lollie Marrow, M.D.   Medications:    . sodium chloride 20 mL/hr (08/03/11 0245)      . cefoTEtan (CEFOTAN) IV  2 g Intravenous Q12H  . doxycycline   100 mg Oral Q12H  . ferrous sulfate  325 mg Oral BID  . Fluticasone-Salmeterol  1 puff Inhalation Q12H  . levothyroxine  112 mcg Oral Q0600  . sodium chloride  3 mL Intravenous Q12H  . DISCONTD: doxycycline (VIBRAMYCIN) IV  100 mg Intravenous Q12H    I  have reviewed scheduled and prn medications.     Problem/Plan: Active Problems:  Abdominal pain  Anemia  Hypothyroidism  Asthma  1. Anemia: Probably from dysfunctional uterine bleeding and chronic kidney disease. Status post 2 units of packed red blood cell transfusion. Stable. Patient also has pancytopenia. Outpatient hematology consultation. 2. Abdominal pain: Unclear etiology. Gynecology has consulted and recommend completing 14 days of doxycycline. No tubo-ovarian abscess seen on ultrasound. Will start Provera 10 mg as per gynecology recommendations for dysfunctional uterine bleeding. 3. Dysfunctional uterine bleeding: Start Provera. 4. Cough and low-grade fevers: We'll check chest x-ray to rule out pneumonic process. Continue current antibiotics. 5. Hypothyroidism status post radioactive iodine ablation: Continue Synthroid. 6. Asthma: Stable 7. Chronic kidney disease: Stable 8. Exophytic nodule mid left kidney: Will order noncontrast MR imaging of the abdomen to determine if it solid or cystic as per radiology recommendations.  Disposition: Possible discharge tomorrow.   Nadiyah Zeis 08/05/2011,5:18 PM  LOS: 3 days

## 2011-08-06 ENCOUNTER — Inpatient Hospital Stay (HOSPITAL_COMMUNITY): Payer: Medicaid Other

## 2011-08-06 ENCOUNTER — Other Ambulatory Visit: Payer: Self-pay | Admitting: Oncology

## 2011-08-06 LAB — URINE CULTURE
Colony Count: >=100000
Culture  Setup Time: 201303181201

## 2011-08-06 LAB — CBC
Hemoglobin: 8.4 g/dL — ABNORMAL LOW (ref 12.0–15.0)
MCH: 23.5 pg — ABNORMAL LOW (ref 26.0–34.0)
MCV: 76 fL — ABNORMAL LOW (ref 78.0–100.0)
Platelets: 100 10*3/uL — ABNORMAL LOW (ref 150–400)
RBC: 3.58 MIL/uL — ABNORMAL LOW (ref 3.87–5.11)

## 2011-08-06 MED ORDER — UNABLE TO FIND: Status: DC

## 2011-08-06 MED ORDER — ACETAMINOPHEN 325 MG PO TABS: 650.0000 mg | ORAL_TABLET | Freq: Four times a day (QID) | ORAL | Status: AC | PRN

## 2011-08-06 MED ORDER — LEVOTHYROXINE SODIUM 137 MCG PO TABS: 137.0000 ug | ORAL_TABLET | Freq: Every day | ORAL | Status: DC

## 2011-08-06 MED ORDER — DOXYCYCLINE HYCLATE 100 MG PO TABS: 100.0000 mg | ORAL_TABLET | Freq: Two times a day (BID) | ORAL | Status: AC

## 2011-08-06 MED ORDER — LEVOFLOXACIN 750 MG PO TABS: 750.0000 mg | ORAL_TABLET | Freq: Every day | ORAL | Status: AC

## 2011-08-06 NOTE — Progress Notes (Signed)
MD, Dr. Waymon Amato, telephoned with new orders for pt. Telephoned pt at [phone] and educated her on UTI and ABT. Also educated of change to her synthroid dosage. Pt verbalizes understanding and states she will pick up. Also encouraged her to call back with any further questions. Dondra Spry

## 2011-08-06 NOTE — Progress Notes (Signed)
Pt. discharged to home. Pt after visit summary reviewed and pt capable of re verbalizing medications and follow up appointments. Pt remains stable. No signs and symptoms of distress. Educated to return to ER in the event of SOB, dizziness, chest pain, or fainting. Latayna Ritchie, RN  

## 2011-08-06 NOTE — Discharge Summary (Signed)
Discharge Summary  Lori Murphy MR#: [id number]  DOB:[birth year]  Date of Admission: 08/02/2011 Date of Discharge: 08/06/2011  Patient's PCP: Quentin Mulling, MD, MD  Attending Physician:Shakai Dolley  Consults: OB/GYN: Dr. Philip Aspen.    Discharge Diagnoses: Active Problems:  Abdominal pain  Anemia  Hypothyroidism: suboptimal suplementation  Asthma Possible pelvic inflammatory disease. Dysfunctional uterine bleeding. Pancytopenia Left kidney lesion seen on multiple imaging studies: Outpatient follow up as deemed necessary. Possible Acute Bronchitis Enterococcus UTI   Brief Admitting History and Physical 36 year-old female with history of post ablative hypothyroidism, asthma and chronic kidney disease with last creatinine 2.2 presented to the ER because of abdominal pain. Patient has been having them down pain since last Tuesday as almost a week ago and she had gone to the ER at [facility] 2 days later and was told that she had PID. She was given IV antibiotic one dose and 1 g of azithromycin and told to follow with PCP or return to the ER if there is any worsening symptoms. In addition patient also was given medroxyprogesterone because patient is having increased menstrual cycle with clots. Patient came to the ER at [facility] because of persistent abdominal pain which is suprapubic with fever chills and nausea vomiting and unable to keep anything. In the ER patient was found to have anemia. The ER physician did a pelvic exam and did find cervical tenderness. Gynecologist on call Dr. Claiborne Billings was consulted by ER patient Dr. Jeraldine Loots. At this time they recommended PRBC transfusion and outpatient followup. And thus hospitalist was called for admission for anemia. Patient denies any chest pain short breath dizziness or loss of consciousness.   Discharge Medications Current Discharge Medication List    START taking these medications   Details  acetaminophen (TYLENOL)  325 MG tablet Take 2 tablets (650 mg total) by mouth every 6 (six) hours as needed for pain or fever (or Fever >/= 101).    doxycycline (VIBRA-TABS) 100 MG tablet Take 1 tablet (100 mg total) by mouth every 12 (twelve) hours. Qty: 20 tablet, Refills: 0      CONTINUE these medications which have NOT CHANGED   Details  albuterol (PROVENTIL HFA;VENTOLIN HFA) 108 (90 BASE) MCG/ACT inhaler Inhale 2 puffs into the lungs every 6 (six) hours as needed. For shortness of breath    ferrous sulfate 325 (65 FE) MG tablet Take 325 mg by mouth 2 (two) times daily. For ten days starting 3/15    Fluticasone-Salmeterol (ADVAIR) 100-50 MCG/DOSE AEPB Inhale 1 puff into the lungs every 12 (twelve) hours.    levothyroxine (SYNTHROID, LEVOTHROID) 112 MCG tablet Take 112 mcg by mouth daily.      STOP taking these medications     medroxyPROGESTERone (PROVERA) 10 MG tablet Comments:  Reason for Stopping:          Hospital Course: <principal problem not specified> Present on Admission:  .Abdominal pain .Anemia .Hypothyroidism .Asthma   1. Anemia: Probably from dysfunctional uterine bleeding and chronic kidney disease. Status post 2 units of packed red blood cell transfusion. Stable. Patient also has pancytopenia. Discussed with Dr. Darnelle Catalan who has kindly arranged for outpatient hematology consultation and their offices will call the patient to be seen within a week. 2. Pancytopenia: Unclear etiology. Outpatient followup with hematology. 3. Presumed pelvic inflammatory disease: Gynecology consulted and extensive workup was done. No tubo-ovarian abscess was found. Patient was placed on IV antibiotics and oral doxycycline. Discussed with Dr. Claiborne Billings prior to discharge and she recommends completing total 2  weeks of oral doxycycline. 4. Abdominal pain: Unclear etiology. Gynecology has consulted and recommend completing 14 days of doxycycline. No tubo-ovarian abscess seen on ultrasound. Could have been  secondary to possible pelvic inflammatory disease versus urinary tract infection 5. Enterococcus urinary tract infection: Will treat with Levaquin on discharge. 6. Dysfunctional uterine bleeding: Discussed with Dr. Claiborne Billings prior to discharge and she recommended that since patient had minimal intermittent vaginal bleeding she would not treat with Provera at this time and have the patient followup outpatient with gynecology. This was discussed with the patient and she verbalized understanding. 7. Cough and low-grade fevers: Possible bronchitis. Complete oral doxycycline which should treat this. 8. Hypothyroidism status post radioactive iodine ablation: Seems to be suboptimally replaced given very high TSH. Will increase Synthroid and need to repeat TSH in 4-6 weeks from hospital discharge. 9. Asthma: Stable 10. Chronic kidney disease: Stable 11. Left kidney lesion seen on multiple imaging studies, said to be stable and benign per MRI findings, per radiology. Outpatient followup as deemed necessary.  Day of Discharge BP 108/68  Pulse 94  Temp(Src) 98.7 F (37.1 C) (Oral)  Resp 19  Ht 5\' 2"  (1.575 m)  Wt 83.8 kg (184 lb 11.9 oz)  BMI 33.79 kg/m2  SpO2 96%  LMP 07/28/2011  General Exam: Comfortable.  Respiratory System: Clear to auscultation. No increased work of breathing.  Cardiovascular System: First and second heart sounds heard. Regular rate and rhythm. No JVD/murmurs. Telemetry showed normal sinus rhythm. Gastrointestinal System: Abdomen is non distended, soft and normal bowel sounds heard.  Central Nervous System: Alert and oriented. No focal neurological deficits.  Extremities: Symmetrical 5 x 5 power. No pedal edema.   Results for orders placed during the hospital encounter of 08/02/11 (from the past 48 hour(s))  CBC     Status: Abnormal   Collection Time   08/05/11  6:35 AM      Component Value Range Comment   WBC 2.7 (*) 4.0 - 10.5 (K/uL)    RBC 3.60 (*) 3.87 - 5.11 (MIL/uL)     Hemoglobin 8.3 (*) 12.0 - 15.0 (g/dL)    HCT 16.1 (*) 09.6 - 46.0 (%)    MCV 75.0 (*) 78.0 - 100.0 (fL)    MCH 23.1 (*) 26.0 - 34.0 (pg)    MCHC 30.7  30.0 - 36.0 (g/dL)    RDW 04.5 (*) 40.9 - 15.5 (%)    Platelets 109 (*) 150 - 400 (K/uL) CONSISTENT WITH PREVIOUS RESULT  CBC     Status: Abnormal   Collection Time   08/06/11  6:25 AM      Component Value Range Comment   WBC 2.9 (*) 4.0 - 10.5 (K/uL)    RBC 3.58 (*) 3.87 - 5.11 (MIL/uL)    Hemoglobin 8.4 (*) 12.0 - 15.0 (g/dL)    HCT 81.1 (*) 91.4 - 46.0 (%)    MCV 76.0 (*) 78.0 - 100.0 (fL)    MCH 23.5 (*) 26.0 - 34.0 (pg)    MCHC 30.9  30.0 - 36.0 (g/dL)    RDW 78.2 (*) 95.6 - 15.5 (%)    Platelets 100 (*) 150 - 400 (K/uL) CONSISTENT WITH PREVIOUS RESULT   Laboratory 1. Anemia panel: Iron 60, total iron-binding capacity 420, ferritin 75, folate 9.6, B12 567. 2. LFTs significant for alkaline phosphatase 36 and albumin 3.3 and total bilirubin 0.2. 3. Absolute reticulocyte count 33 4. Urine pregnancy test was negative. 5. TSH 83.575. 6. Chlamydia and GC probe were negative 7. Wet  prep was negative 8. Urine culture showed greater than 100,000 colonies per mL of enterococcus species that was sensitive to ampicillin, levofloxacin, nitrofurantoin and vancomycin but resistant to tetracycline.   Ct Abdomen Pelvis Wo Contrast  08/03/2011  *RADIOLOGY REPORT*  Clinical Data: Abdominal pain.  Evaluate for potential tubo-ovarian abscess.  CT ABDOMEN AND PELVIS WITHOUT CONTRAST  Technique:  Multidetector CT imaging of the abdomen and pelvis was performed following the standard protocol without intravenous contrast.  Comparison: Pelvic ultrasound dated 08/03/2011.  CT of the abdomen and pelvis dated 05/24/2010.  Findings:  Lung Bases: A small amount of pleural thickening over the lateral aspect of the left lower lobe. Small hiatal hernia with mild circumferential thickening of the distal esophagus.  Otherwise, unremarkable.  Abdomen/Pelvis:   Status post cholecystectomy.  The unenhanced appearance of the liver, pancreas, spleen and bilateral adrenal glands is unremarkable.  The right kidney appears atrophic with multiple regions of parenchymal scarring.  Left kidney has an irregular contour, with at least one exophytic isoattenuating lesion measuring 1.7 cm (image 26 of series 2), that could represent a solid neoplasm.  The there is also some probable scarring in the upper pole of the left kidney.  All of these findings are poorly evaluated without IV contrast.  No abnormal calcifications within the collecting system of either kidney, along the course of either ureter, or within the lumen of the urinary bladder.  No hydroureteronephrosis or perinephric stranding at this time.  There is a trace volume of free fluid in the cul-de-sac, likely physiologic in this young female. No pneumoperitoneum. No pathologic distension of bowel.  There appears be some fluid within the endometrial canal.  The uterus and ovaries are otherwise unremarkable in appearance.  On this noncontrast CT scan, no definite signs to suggest tubo-ovarian abscess are appreciated. The patient is status post bilateral tubal ligation.  Urinary bladder is unremarkable in appearance.  Musculoskeletal: There are no aggressive appearing lytic or blastic lesions noted in the visualized portions of the skeleton.  IMPRESSION: 1.  No findings to suggest the presence of a tubo-ovarian abscess on today's noncontrast CT examination. 2.  Small volume of free fluid in the cul-de-sac is presumably physiologic in this young female. 3.  Status post bilateral tubal ligation and cholecystectomy. 4.  There appears to be extensive parenchymal scarring in the right kidney, likely related to prior episodes of infection/infarction. Additionally, there is likely some scarring in the upper pole of the left kidney, and there is a 1.7 cm soft tissue attenuation lesion extending exophytically from the interpolar region  of the left kidney that could represent a small solid neoplasm.  However, this lesion is unchanged in size and appearance compared to prior study from 05/24/2010 such that if this is a solid neoplasm, it may represent a benign lesion such as an oncocytoma.  This can be further characterized with renal ultrasound if clinically indicated. 5.  Small hiatal hernia with mild circumferential thickening of the distal esophagus.  These findings could suggest reflux esophagitis.  Original Report Authenticated By: Florencia Reasons, M.D.   Dg Chest 2 View  08/05/2011  *RADIOLOGY REPORT*  Clinical Data: Asthma, cough, shortness of breath  CHEST - 2 VIEW  Comparison: 05/21/2011  Findings: Normal heart size, mediastinal contours, and pulmonary vascularity. Lungs clear. No pleural effusion or pneumothorax. Bones unremarkable. Question mild central peribronchial thickening.  IMPRESSION: Question mild bronchitic changes. No acute infiltrate.  Original Report Authenticated By: Lollie Marrow, M.D.   Mr Abdomen  Wo Contrast  08/06/2011  RADIOLOGY REPORT*  Clinical Data: Evaluate the left renal lesion seen on comparison CT and ultrasound.  MRI ABDOMEN WITHOUT CONTRAST  Technique:  Multiplanar multisequence MR imaging of the abdomen was performed. No intravenous contrast was administered.  Comparison: Ultrasound 08/04/2011, CT 08/03/2011, CT thorax 12/02/2006 and CT abdomen 05/07/2009  Findings: There is an exophytic lesion from the mid-left kidney which measures 16 x 10 mm (image 18, series 4).  This is the lesion of concern on comparison CT and ultrasound.  Compared to CT of 05/07/2009 lesion is unchanged in size which time it measures 16 x 12 mm.  Similarly the lesion  measures 16 x 10 mm on the chest CT of 12/02/2006.  On the more remote CT studies,  the lesion was isodense to the renal parenchyma these contrast studies.  On today's scan the lesion is isointense to the liver parenchyma on all pulse sequences.  The lesion does  not demonstrate restricted diffusion.  There is cortical scarring of the right kidney.  No focal hepatic lesion.  The pancreas, spleen, adrenal glands are normal.  The stomach limited view of the small bowel colon normal.  Abdominal aorta normal caliber.  Trace effusions are noted.  IMPRESSION:  Stable lesion within the left kidney compared to CT of 12/02/2006. This lesion has the same imaging characteristics as normal renal parenchyma on all CT and MRI studies examined and is most consistent with a benign lesion,  likely a abnormal renal contour.  Original Report Authenticated By: Genevive Bi, M.D.   US Transvaginal Non-ob  08/03/2011  *RADIOLOGY REPORT*  Clinical Data: Abdominal pain.  Concern for tubo-ovarian abscess  TRANSABDOMINAL AND TRANSVAGINAL ULTRASOUND OF PELVIS Technique:  Both transabdominal and transvaginal ultrasound examinations of the pelvis were performed. Transabdominal technique was performed for global imaging of the pelvis including uterus, ovaries, adnexal regions, and pelvic cul-de-sac.  Comparison: CT same date   It was necessary to proceed with endovaginal exam following the transabdominal exam to visualize the endometrium.  Findings:  Uterus: Anteverted, anteflexed.  10.5 x 6.6 x 6.0 cm.  No focal myometrial abnormality.  Endometrium: Distended by fluid/debris, with endometrial canal echogenic filling defect measuring 1.7 cm maximally.  Adjacent endometrial thickness measures 4 mm.  Right ovary:  3.2 x 1.5 x 1.0 cm.  Normal.  Left ovary: 2.4 x 1.5 x 1.3 cm.  Normal.  Other findings: Trace free fluid noted in the cul-de-sac.  IMPRESSION: Distention of the endometrial canal body fluid/debris, with echogenic mass like filling defect, most likely polyp, although endometrial hyperplasia or neoplasia could have a similar appearance. Endometritis is not excluded, if there has been recent instrumentation. Hysteroscopic evaluation is recommended with sampling directed towards the anterior  endometrial canal.  The ovaries are normal bilaterally without evidence of tubal ovarian abscess.  Original Report Authenticated By: Harrel Lemon, M.D.   US Pelvis Complete  08/03/2011  *RADIOLOGY REPORT*  Clinical Data: Abdominal pain.  Concern for tubo-ovarian abscess  TRANSABDOMINAL AND TRANSVAGINAL ULTRASOUND OF PELVIS Technique:  Both transabdominal and transvaginal ultrasound examinations of the pelvis were performed. Transabdominal technique was performed for global imaging of the pelvis including uterus, ovaries, adnexal regions, and pelvic cul-de-sac.  Comparison: CT same date   It was necessary to proceed with endovaginal exam following the transabdominal exam to visualize the endometrium.  Findings:  Uterus: Anteverted, anteflexed.  10.5 x 6.6 x 6.0 cm.  No focal myometrial abnormality.  Endometrium: Distended by fluid/debris, with endometrial canal echogenic filling  defect measuring 1.7 cm maximally.  Adjacent endometrial thickness measures 4 mm.  Right ovary:  3.2 x 1.5 x 1.0 cm.  Normal.  Left ovary: 2.4 x 1.5 x 1.3 cm.  Normal.  Other findings: Trace free fluid noted in the cul-de-sac.  IMPRESSION: Distention of the endometrial canal body fluid/debris, with echogenic mass like filling defect, most likely polyp, although endometrial hyperplasia or neoplasia could have a similar appearance. Endometritis is not excluded, if there has been recent instrumentation. Hysteroscopic evaluation is recommended with sampling directed towards the anterior endometrial canal.  The ovaries are normal bilaterally without evidence of tubal ovarian abscess.  Original Report Authenticated By: Harrel Lemon, M.D.   US Renal  08/04/2011  *RADIOLOGY REPORT*  Clinical Data: Abnormal CT exam with question mass versus cyst of left kidney  RENAL/URINARY TRACT ULTRASOUND COMPLETE  Comparison:  01/11/2010 Correlation:  CT abdomen 08/03/2011  Findings:  Right Kidney:  7.9 cm length.  Small and atrophic in appearance.  Increased cortical echogenicity.  No gross mass or hydronephrosis. No shadowing calcification.  Left Kidney:  10.6 cm length.  Upper normal cortical echogenicity. Hypoechoic nodule at upper pole left kidney medially, 1.5 x 1.3 x 2.1 cm, corresponding to a poorly defined low attenuation lesion at this site on CT.  The exophytic nodule identified at the lateral aspect of the mid left kidney on prior CT is not sonographically identified. No additional renal mass, hydronephrosis, or shadowing calcification.  Bladder:  Partially distended, morphologically normal appearance. Ureteral jets were not visualized.  IMPRESSION: Atrophic scarred right kidney. Tiny cyst upper pole left kidney. Exophytic nodule identified at the lateral aspect of the mid left kidney prior CT is not sonographically localized. It is uncertain whether this represents lobulated renal parenchyma or an exophytic solid or cystic lesion. In light of patient's renal dysfunction, recommend noncontrast MR imaging of the abdomen to determine if a solid or cystic lesion is present at the lateral aspect of the mid left kidney.  Original Report Authenticated By: Lollie Marrow, M.D.     Disposition: Discharged home in stable condition.  Diet: Heart healthy diet.  Activity: Increase activity gradually.  Follow-up Appts: Discharge Orders    Future Appointments: Provider: Department: Dept Phone: Center:   09/03/2011 1:15 PM Adam Phenix, MD [facility] [phone] [facility]     Future Orders Please Complete By Expires   Diet - low sodium heart healthy      Increase activity slowly      Call MD for:  temperature >100.4      Call MD for:  extreme fatigue      Call MD for:  persistant dizziness or light-headedness      Call MD for:  difficulty breathing, headache or visual disturbances         TESTS THAT NEED FOLLOW-UP CBC in 5-7 days from hospital discharge TSH in 4-6 weeks from hospital discharge   Time spent on discharge, talking  to the patient, and coordinating care: 45 mins.   SignedMarcellus Scott, MD 08/06/2011, 6:46 PM

## 2011-08-09 ENCOUNTER — Other Ambulatory Visit: Payer: Self-pay | Admitting: Oncology

## 2011-08-10 ENCOUNTER — Telehealth: Payer: Self-pay | Admitting: Oncology

## 2011-08-10 NOTE — Telephone Encounter (Signed)
Called pt but was not able to reach her re appt w/FS or lm - 1st attempt.

## 2011-08-11 ENCOUNTER — Telehealth: Payer: Self-pay | Admitting: Oncology

## 2011-08-11 NOTE — Telephone Encounter (Signed)
Still not able to reach pt re appt for FS - no vm. 2nd attempt.

## 2011-08-12 ENCOUNTER — Other Ambulatory Visit: Payer: Self-pay | Admitting: Oncology

## 2011-08-13 ENCOUNTER — Telehealth: Payer: Self-pay | Admitting: Oncology

## 2011-08-13 NOTE — Telephone Encounter (Signed)
S/w pt today @ work and gv her appt for 4/4 @ 10:30 am w/FS. Pt given appt d/t/location and phone numebr. Also confirmed pt's home number and number is as listed in EPIC.

## 2011-08-14 ENCOUNTER — Telehealth: Payer: Self-pay | Admitting: Oncology

## 2011-08-14 NOTE — Telephone Encounter (Signed)
Dx-Pancytopenia

## 2011-08-16 ENCOUNTER — Other Ambulatory Visit: Payer: Self-pay | Admitting: Oncology

## 2011-08-16 DIAGNOSIS — D649 Anemia, unspecified: Secondary | ICD-10-CM

## 2011-08-19 ENCOUNTER — Encounter: Payer: Self-pay | Admitting: *Deleted

## 2011-08-20 ENCOUNTER — Other Ambulatory Visit (HOSPITAL_BASED_OUTPATIENT_CLINIC_OR_DEPARTMENT_OTHER): Payer: Self-pay | Admitting: Lab

## 2011-08-20 ENCOUNTER — Ambulatory Visit: Payer: Self-pay

## 2011-08-20 ENCOUNTER — Encounter: Payer: Self-pay | Admitting: Oncology

## 2011-08-20 ENCOUNTER — Telehealth: Payer: Self-pay | Admitting: Oncology

## 2011-08-20 ENCOUNTER — Ambulatory Visit (HOSPITAL_BASED_OUTPATIENT_CLINIC_OR_DEPARTMENT_OTHER): Payer: Self-pay | Admitting: Oncology

## 2011-08-20 VITALS — BP 120/81 | HR 70 | Temp 97.2°F | Ht 63.5 in | Wt 188.4 lb

## 2011-08-20 DIAGNOSIS — D72819 Decreased white blood cell count, unspecified: Secondary | ICD-10-CM

## 2011-08-20 DIAGNOSIS — N289 Disorder of kidney and ureter, unspecified: Secondary | ICD-10-CM

## 2011-08-20 DIAGNOSIS — D696 Thrombocytopenia, unspecified: Secondary | ICD-10-CM

## 2011-08-20 DIAGNOSIS — D649 Anemia, unspecified: Secondary | ICD-10-CM

## 2011-08-20 LAB — CBC WITH DIFFERENTIAL/PLATELET
BASO%: 1.2 % (ref 0.0–2.0)
Eosinophils Absolute: 0.1 10*3/uL (ref 0.0–0.5)
MCHC: 31.2 g/dL — ABNORMAL LOW (ref 31.5–36.0)
MONO#: 0.4 10*3/uL (ref 0.1–0.9)
NEUT#: 2.6 10*3/uL (ref 1.5–6.5)
Platelets: 129 10*3/uL — ABNORMAL LOW (ref 145–400)
RBC: 3.68 10*6/uL — ABNORMAL LOW (ref 3.70–5.45)
RDW: 21.8 % — ABNORMAL HIGH (ref 11.2–14.5)
WBC: 4.3 10*3/uL (ref 3.9–10.3)
lymph#: 1.2 10*3/uL (ref 0.9–3.3)
nRBC: 0 % (ref 0–0)

## 2011-08-20 LAB — COMPREHENSIVE METABOLIC PANEL
ALT: <8 U/L (ref 0–35)
AST: 15 U/L (ref 0–37)
Albumin: 4 g/dL (ref 3.5–5.2)
CO2: 24 mEq/L (ref 19–32)
Calcium: 8.1 mg/dL — ABNORMAL LOW (ref 8.4–10.5)
Chloride: 110 mEq/L (ref 96–112)
Creatinine, Ser: 2.01 mg/dL — ABNORMAL HIGH (ref 0.50–1.10)
Potassium: 4.3 mEq/L (ref 3.5–5.3)
Sodium: 142 mEq/L (ref 135–145)
Total Protein: 6.5 g/dL (ref 6.0–8.3)

## 2011-08-20 LAB — CHCC SMEAR

## 2011-08-20 LAB — IRON AND TIBC: TIBC: 406 ug/dL (ref 250–470)

## 2011-08-20 MED ORDER — FERROUS SULFATE 325 (65 FE) MG PO TABS: 325.0000 mg | ORAL_TABLET | Freq: Two times a day (BID) | ORAL | Status: DC

## 2011-08-20 NOTE — Telephone Encounter (Signed)
gve the pt her may 2013 appt calendar 

## 2011-08-20 NOTE — Progress Notes (Signed)
CC:   Lacretia Leigh. Quintella Reichert, M.D.  REASON FOR CONSULTATION:  PE anemia.  HISTORY OF PRESENT ILLNESS:  This is a 36 year old pleasant woman, native of [place], lived the majority of her life around [place], currently works in [employer].  She has a significant past medical history for hypothyroidism and dysfunctional uterine bleeding.  She reported about for the last 2-3 months she has had rather intermittent and, at times, persistent menstrual bleeding.  She has been diagnosed with dysfunctional uterine bleeding and PID on a few occasions.  The patient was hospitalized briefly between 03/17 and 03/21 for abdominal distention and, during her hospitalization, she was noticed to be pancytopenic.  Her hemoglobin was as low as 6.1 and her white cell counts have ranged between 2.3-2.7 and all the way to 3.0. Platelet counts have been as high as 127.  Her MCV was low at 76 and RDW was high at 16.8.  She was also noted to have a chronic renal insufficiency with a creatinine around 2.3-2.6.  For that reason, the patient was referred to me for evaluation.  Upon interviewing Ms. Mcguirt, she is reporting since her discharge from the hospital her menstrual bleeding has improved.  She still is having quite a bit of fatigue, quite a bit of tiredness.  She has some ice cravings but she has not had any further abdominal discomfort but does report some occasional abdominal bloating.  She had not had any GI bleeding.  Had not reported any hematochezia, had not reported any melena, had not reported any epistaxis.  REVIEW OF SYSTEMS:  She did not report any headaches, blurred vision, double vision.  She did not report any motor or sensory neuropathy, alteration in mental status.  She did not report any psychiatric issues, depression.  She did not report any fever, chills, sweats.  She did not report any cough, hemoptysis, hematemesis.  No nausea, vomiting, abdominal pain, hematochezia,  melena.  No genitourinary complaints. Rest of review of systems is unremarkable.  PAST MEDICAL HISTORY:  Significant for anemia, hypothyroidism, PID, dysfunctional uterine bleeding, history of renal insufficiency, history of enterococcus UTI.  MEDICATION:  She is on currently on Tylenol, albuterol, iron sulfate which she has stopped taking since she has left the hospital.  She is on Advair, Synthroid.  ALLERGIES:  Penicillin.  SOCIAL HISTORY:  She denied any alcohol or tobacco abuse.  She has 5 children.  She was pregnant 6 times.  FAMILY HISTORY:  Really no history of blood disorders or any malignancies.  PHYSICAL EXAMINATION:  General:  Alert, awake female, appeared in no active distress.  Vital signs:  Her blood pressure is 120/81, pulse 70, respirations 20.  She is afebrile.  HEENT:  Head is normocephalic, atraumatic.  Pupils equal, round, reactive to light.  Oral mucosa moist and pink.  Neck:  Supple without adenopathy.  Heart:  Regular rate, S1, S2.  Lungs:  Clear auscultation.  Abdomen:  Soft, nontender.  No hepatosplenomegaly.  Extremities:  No clubbing, cyanosis, or edema. Neurological:  Intact motor, sensory and deep tendon reflexes.  LABORATORY DATA:  Showed a hemoglobin 8.9, white cell count 4.3, platelet count 129.  MCV 77, RDW of 21.8.  Her hospital peripheral smear did not show any evidence of any schistocytosis or red cell fragmentation.  Her folate level was normal.  Vitamin B12 normal at 567. Iron levels showed a saturation of 14%.  Iron level of 60.  Ferritin is 75 but that could presumably be after transfusion.  Creatinine is about 2.35.  IMAGING STUDIES:  Reviewed today including an MRI of the abdomen done on 07/19/2011 which showed she had a stable lesion in the left kidney. This appeared to be benign, unchanged since 2008, not really any other intra-abdominal abnormalities.  She had also a CT scan of the abdomen on 08/03/2011 which showed basically no  finding suggestive of tubo-ovarian abscess.  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  ASSESSMENT AND PLAN:  This is a pleasant 36 year old woman with the following issues: 1. Microcytic hypochromic anemia as evident with a low MCV, elevated     RDW and undoubtedly related to iron deficiency and heavy menstrual     bleeding.  Although her iron stores are not highly suggestive of     that but those might have been done after a transfusion so it was     difficult to really judge them but clearly clinically she presents     with iron deficiency.  I am rechecking her iron stores today to     confirm that, but I think this is the most likely etiology.  She     could also have an element of renal insufficiency given her     creatinine and chronic renal insufficiency.  I have talked to her     about iron replacement today in details.  I talked about p.o. iron     versus IV iron.  For the time being, she would like to try p.o.     iron supplements.  I talked to her about the risks and benefits of     Feraheme, IV infusion.  Since she could not tolerate p.o. iron, she     is agreeable to proceed with Feraheme if she could not tolerate the     IV iron.  Other etiologies, again, I doubt that she has a bone     marrow disease and do not think she has myelodysplastic syndrome or     plasma cell disorder at this time but certainly those are     consideration if her irons did not corrected her hemoglobin. 2. Thrombocytopenia and leukopenia.  I think those are reactive due to     her recent acute illness and seems to be recovering quite nicely. 3. Renal insufficiency.  Seems to be due to recurrent scarring of her     kidneys rather than a plasma cell disorder causing her to be anemic     as well.  Followup will be in 6 weeks' time.  I will reassess her     hemoglobin and iron stores at that  time.    ______________________________ Benjiman Core, M.D. FNS/MEDQ  D:  08/20/2011  T:  08/20/2011  Job:  [id number]

## 2011-08-20 NOTE — Progress Notes (Signed)
Patient came in today as a new patient,she has no insurance,but she is going to go Monday to apply for Medicaid.I gave her an epp application to take home and fill out.

## 2011-08-20 NOTE — Progress Notes (Signed)
Note dictated

## 2011-09-03 ENCOUNTER — Ambulatory Visit (INDEPENDENT_AMBULATORY_CARE_PROVIDER_SITE_OTHER): Payer: Self-pay | Admitting: Obstetrics & Gynecology

## 2011-09-03 ENCOUNTER — Other Ambulatory Visit (HOSPITAL_COMMUNITY)
Admission: RE | Admit: 2011-09-03 | Discharge: 2011-09-03 | Disposition: A | Discharge status: Home / Self Care | Payer: Medicaid Other | Source: Ambulatory Visit | Attending: Obstetrics & Gynecology | Admitting: Obstetrics & Gynecology

## 2011-09-03 ENCOUNTER — Encounter: Payer: Self-pay | Admitting: Obstetrics & Gynecology

## 2011-09-03 VITALS — BP 123/77 | HR 77 | Temp 98.5°F | Ht 63.0 in | Wt 183.2 lb

## 2011-09-03 DIAGNOSIS — N939 Abnormal uterine and vaginal bleeding, unspecified: Secondary | ICD-10-CM

## 2011-09-03 DIAGNOSIS — N938 Other specified abnormal uterine and vaginal bleeding: Secondary | ICD-10-CM | POA: Insufficient documentation

## 2011-09-03 DIAGNOSIS — N840 Polyp of corpus uteri: Secondary | ICD-10-CM | POA: Insufficient documentation

## 2011-09-03 DIAGNOSIS — N949 Unspecified condition associated with female genital organs and menstrual cycle: Secondary | ICD-10-CM

## 2011-09-03 DIAGNOSIS — Z01419 Encounter for gynecological examination (general) (routine) without abnormal findings: Secondary | ICD-10-CM | POA: Insufficient documentation

## 2011-09-03 DIAGNOSIS — Z01812 Encounter for preprocedural laboratory examination: Secondary | ICD-10-CM

## 2011-09-03 DIAGNOSIS — N898 Other specified noninflammatory disorders of vagina: Secondary | ICD-10-CM

## 2011-09-03 DIAGNOSIS — N950 Postmenopausal bleeding: Secondary | ICD-10-CM

## 2011-09-03 LAB — POCT PREGNANCY, URINE: Preg Test, Ur: NEGATIVE

## 2011-09-03 NOTE — Progress Notes (Signed)
Subjective:    Patient ID: Lori Murphy, female    DOB: [birth year], 36 y.o.   MRN: [id number]  [id number] Patient's last menstrual period was 08/19/2011. Patient has history of dysfunctional uterine bleeding and heavy periods. She was [facility] in March 2013 [facility] with [facility] with pelvic pain and dysfunctional bleeding. She was anemic and received transfusion. She was treated for pelvic inflammatory disease. Patient was referred to gynecology clinic for followup. Says she bleed several times a month and often heavy. An ultrasound was done while she was hospitalized but she says she does not know the results. Past Medical History  Diagnosis Date  . Renal disorder   . Thyroid disease   . DUB (dysfunctional uterine bleeding)   . PID (acute pelvic inflammatory disease)   . Asthma   . Lung disease   . Anemia    Past Surgical History  Procedure Date  . Cholecystectomy   . Tubal ligation   . Thyriod radiation   . Multiple tooth extractions     6 teeth   Current Outpatient Prescriptions on File Prior to Visit  Medication Sig Dispense Refill  . acetaminophen (TYLENOL) 325 MG tablet Take 2 tablets (650 mg total) by mouth every 6 (six) hours as needed for pain or fever (or Fever >/= 101).      Marland Kitchen albuterol (PROVENTIL HFA;VENTOLIN HFA) 108 (90 BASE) MCG/ACT inhaler Inhale 2 puffs into the lungs every 6 (six) hours as needed. For shortness of breath      . Dextromethorphan Polistirex (DELSYM PO) Take by mouth as needed.      . ferrous sulfate 325 (65 FE) MG tablet Take 1 tablet (325 mg total) by mouth 2 (two) times daily with a meal. For ten days starting 3/15  120 tablet  3  . Fluticasone-Salmeterol (ADVAIR) 100-50 MCG/DOSE AEPB Inhale 1 puff into the lungs every 12 (twelve) hours.      Marland Kitchen levothyroxine (SYNTHROID, LEVOTHROID) 137 MCG tablet Take 1 tablet (137 mcg total) by mouth daily.  30 tablet  0  . UNABLE TO FIND Ms. Yoshie Kosel was admitted to the [facility] from  08/03/2011 to 08/06/2011 for evaluation and treatment of a medical illness. She can return to work on 08/10/2011.  Please feel free to contact us for any further information.  [facility] Phone number: [phone]  1 Units  0   Allergies  Allergen Reactions  . Codeine Anaphylaxis and Swelling  . Food Anaphylaxis    Milk, coconut, mushrooms, seafood  . Penicillins Anaphylaxis and Swelling  . Other Itching    Pet hair  . Seasonal Other (See Comments)    Exacerbates asthma   History   Social History  . Marital Status: Single    Spouse Name: N/A    Number of Children: N/A  . Years of Education: N/A   Occupational History  . Not on file.   Social History Main Topics  . Smoking status: Former Games developer  . Smokeless tobacco: Never Used  . Alcohol Use: No     occasional  mixed drink  . Drug Use: No  . Sexually Active: Yes    Birth Control/ Protection: None   Other Topics Concern  . Not on file   Social History Narrative  . No narrative on file   Family History  Problem Relation Age of Onset  . COPD Father   . Heart disease Father        Review of Systems  Constitutional: Negative.  Respiratory: Negative.   Genitourinary: Negative for dysuria, frequency, vaginal bleeding and difficulty urinating.       Vaginal dryness  Neurological: Negative for dizziness.       Objective:   Physical Exam  Constitutional: She is oriented to person, place, and time. She appears well-developed and well-nourished. No distress.  Neck: Normal range of motion.  Pulmonary/Chest: Effort normal.  Abdominal: Soft. She exhibits no distension and no mass. There is no tenderness.  Genitourinary: Vagina normal and uterus normal.       Mild tenderness  Neurological: She is alert and oriented to person, place, and time.  Skin: Skin is warm and dry.  Psychiatric: She has a normal mood and affect.   Filed Vitals:   09/03/11 1345  BP: 123/77  Pulse: 77  Temp: 98.5  F (36.9 C)  TempSrc: Oral  Height: 5\' 3"  (1.6 m)  Weight: 183 lb 3.2 oz (83.099 kg)   No acute distress normal affect   *RADIOLOGY REPORT*  Clinical Data: Abdominal pain. Concern for tubo-ovarian abscess  TRANSABDOMINAL AND TRANSVAGINAL ULTRASOUND OF PELVIS  Technique: Both transabdominal and transvaginal ultrasound  examinations of the pelvis were performed. Transabdominal technique  was performed for global imaging of the pelvis including uterus,  ovaries, adnexal regions, and pelvic cul-de-sac.  Comparison: CT same date  It was necessary to proceed with endovaginal exam following the  transabdominal exam to visualize the endometrium.  Findings:  Uterus: Anteverted, anteflexed. 10.5 x 6.6 x 6.0 cm. No focal  myometrial abnormality.  Endometrium: Distended by fluid/debris, with endometrial canal  echogenic filling defect measuring 1.7 cm maximally. Adjacent  endometrial thickness measures 4 mm.  Right ovary: 3.2 x 1.5 x 1.0 cm. Normal.  Left ovary: 2.4 x 1.5 x 1.3 cm. Normal.  Other findings: Trace free fluid noted in the cul-de-sac.  IMPRESSION:  Distention of the endometrial canal body fluid/debris, with  echogenic mass like filling defect, most likely polyp, although  endometrial hyperplasia or neoplasia could have a similar  appearance. Endometritis is not excluded, if there has been recent  instrumentation. Hysteroscopic evaluation is recommended with  sampling directed towards the anterior endometrial canal.  The ovaries are normal bilaterally without evidence of tubal  ovarian abscess.  Original Report Authenticated By: Harrel Lemon, M.D.     Pap test and an endometrial biopsy was obtained with pink tissue seen. Uterus sounded to 8 cm.      Assessment & Plan:  Functional uterine bleeding and endometrial polyp identified on ultrasound. Patient will apply for financial assistance. I offered hysteroscopy removal of endometrial polyp and endometrial ablation. She  return in 2 weeks to review her test results and will try to schedule surgery.  Dr. Scheryl Darter 09/03/2011

## 2011-09-03 NOTE — Patient Instructions (Signed)

## 2011-09-24 ENCOUNTER — Ambulatory Visit (INDEPENDENT_AMBULATORY_CARE_PROVIDER_SITE_OTHER): Payer: Medicaid Other | Admitting: Obstetrics and Gynecology

## 2011-09-24 VITALS — BP 113/76 | HR 83 | Temp 98.4°F | Ht 63.0 in | Wt 188.2 lb

## 2011-09-24 DIAGNOSIS — N938 Other specified abnormal uterine and vaginal bleeding: Secondary | ICD-10-CM

## 2011-09-24 DIAGNOSIS — N840 Polyp of corpus uteri: Secondary | ICD-10-CM

## 2011-09-24 DIAGNOSIS — N926 Irregular menstruation, unspecified: Secondary | ICD-10-CM

## 2011-09-24 DIAGNOSIS — N939 Abnormal uterine and vaginal bleeding, unspecified: Secondary | ICD-10-CM

## 2011-09-24 DIAGNOSIS — R51 Headache: Secondary | ICD-10-CM

## 2011-09-24 MED ORDER — MEDROXYPROGESTERONE ACETATE 10 MG PO TABS: 10.0000 mg | ORAL_TABLET | Freq: Every day | ORAL | Status: DC

## 2011-09-24 MED ORDER — BUTALBITAL-APAP-CAFFEINE 50-325-40 MG PO TABS: 1.0000 | ORAL_TABLET | Freq: Two times a day (BID) | ORAL | Status: AC | PRN

## 2011-09-24 NOTE — Patient Instructions (Signed)
Endometrial Ablation Endometrial ablation removes the lining of the uterus (endometrium). It is usually a same day, outpatient treatment. Ablation helps avoid major surgery (such as a hysterectomy). A hysterectomy is removal of the cervix and uterus. Endometrial ablation has less risk and complications, has a shorter recovery period and is less expensive. After endometrial ablation, most women will have little or no menstrual bleeding. You may not keep your fertility. Pregnancy is no longer likely after this procedure but if you are pre-menopausal, you still need to use a reliable method of birth control following the procedure because pregnancy can occur. REASONS TO HAVE THE PROCEDURE MAY INCLUDE:  Heavy periods.   Bleeding that is causing anemia.   Anovulatory bleeding, very irregular, bleeding.   Bleeding submucous fibroids (on the lining inside the uterus) if they are smaller than 3 centimeters.  REASONS NOT TO HAVE THE PROCEDURE MAY INCLUDE:  You wish to have more children.   You have a pre-cancerous or cancerous problem. The cause of any abnormal bleeding must be diagnosed before having the procedure.   You have pain coming from the uterus.   You have a submucus fibroid larger than 3 centimeters.   You recently had a baby.   You recently had an infection in the uterus.   You have a severe retro-flexed, tipped uterus and cannot insert the instrument to do the ablation.   You had a Cesarean section or deep major surgery on the uterus.   The inner cavity of the uterus is too large for the endometrial ablation instrument.  RISKS AND COMPLICATIONS   Perforation of the uterus.   Bleeding.   Infection of the uterus, bladder or vagina.   Injury to surrounding organs.   Cutting the cervix.   An air bubble to the lung (air embolus).   Pregnancy following the procedure.   Failure of the procedure to help the problem requiring hysterectomy.   Decreased ability to diagnose  cancer in the lining of the uterus.  BEFORE THE PROCEDURE  The lining of the uterus must be tested to make sure there is no pre-cancerous or cancer cells present.   Medications may be given to make the lining of the uterus thinner.   Ultrasound may be used to evaluate the size and look for abnormalities of the uterus.   Future pregnancy is not desired.  PROCEDURE  There are different ways to destroy the lining of the uterus.   Resectoscope - radio frequency-alternating electric current is the most common one used.   Cryotherapy - freezing the lining of the uterus.   Heated Free Liquid - heated salt (saline) solution inserted into the uterus.   Microwave - uses high energy microwaves in the uterus.   Thermal Balloon - a catheter with a balloon tip is inserted into the uterus and filled with heated fluid.  Your caregiver will talk with you about the method used in this clinic. They will also instruct you on the pros and cons of the procedure. Endometrial ablation is performed along with a procedure called operative hysteroscopy. A narrow viewing tube is inserted through the birth canal (vagina) and through the cervix into the uterus. A tiny camera attached to the viewing tube (hysteroscope) allows the uterine cavity to be shown on a TV monitor during surgery. Your uterus is filled with a harmless liquid to make the procedure easier. The lining of the uterus is then removed. The lining can also be removed with a resectoscope which allows your surgeon   to cut away the lining of the uterus under direct vision. Usually, you will be able to go home within an hour after the procedure. HOME CARE INSTRUCTIONS   Do not drive for 24 hours.   No tampons, douching or intercourse for 2 weeks or until your caregiver approves.   Rest at home for 24 to 48 hours. You may then resume normal activities unless told differently by your caregiver.   Take your temperature two times a day for 4 days, and record  it.   Take any medications your caregiver has ordered, as directed.   Use some form of contraception if you are pre-menopausal and do not want to get pregnant.  Bleeding after the procedure is normal. It varies from light spotting and mildly watery to bloody discharge for 4 to 6 weeks. You may also have mild cramping. Only take over-the-counter or prescription medicines for pain, discomfort, or fever as directed by your caregiver. Do not use aspirin, as this may aggravate bleeding. Frequent urination during the first 24 hours is normal. You will not know how effective your surgery is until at least 3 months after the surgery. SEEK IMMEDIATE MEDICAL CARE IF:   Bleeding is heavier than a normal menstrual cycle.   An oral temperature above 102 F (38.9 C) develops.   You have increasing cramps or pains not relieved with medication or develop belly (abdominal) pain which does not seem to be related to the same area of earlier cramping and pain.   You are light headed, weak or have fainting episodes.   You develop pain in the shoulder strap areas.   You have chest or leg pain.   You have abnormal vaginal discharge.   You have painful urination.  Document Released: 03/13/2004 Document Revised: 04/23/2011 Document Reviewed: 06/11/2007 [employer] Patient Information 2012 [employer]. 

## 2011-09-26 NOTE — Progress Notes (Signed)
Lori Murphy [id number]  Chief Complaint  Patient presents with  . Follow-up    test results       SUBJECTIVE  HPI: 36 yo [id number] followed for DUB had endometrial biopsy at last visit 09/03/11 showing proliferative endometrium and benign endometrial polyp with no evidence of hyperplasia or carcinoma. Pap was negative. Hx anemia and transfusion , taking iron supplement. Began bleeding moderately- heavily with cramping 2 days ago. No orthostatic sx. Last hgb 8.9. Requesting definitive surgical tx.    Past Medical History  Diagnosis Date  . Renal disorder   . Thyroid disease   . DUB (dysfunctional uterine bleeding)   . PID (acute pelvic inflammatory disease)   . Asthma   . Lung disease   . Anemia    Past Surgical History  Procedure Date  . Cholecystectomy   . Tubal ligation   . Thyriod radiation   . Multiple tooth extractions     6 teeth   History   Social History  . Marital Status: Single    Spouse Name: N/A    Number of Children: N/A  . Years of Education: N/A   Occupational History  . Not on file.   Social History Main Topics  . Smoking status: Former Games developer  . Smokeless tobacco: Never Used  . Alcohol Use: No     occasional  mixed drink  . Drug Use: No  . Sexually Active: Yes    Birth Control/ Protection: None   Other Topics Concern  . Not on file   Social History Narrative  . No narrative on file   Current Outpatient Prescriptions on File Prior to Visit  Medication Sig Dispense Refill  . acetaminophen (TYLENOL) 325 MG tablet Take 2 tablets (650 mg total) by mouth every 6 (six) hours as needed for pain or fever (or Fever >/= 101).      Marland Kitchen albuterol (PROVENTIL HFA;VENTOLIN HFA) 108 (90 BASE) MCG/ACT inhaler Inhale 2 puffs into the lungs every 6 (six) hours as needed. For shortness of breath      . Dextromethorphan Polistirex (DELSYM PO) Take by mouth as needed.      . ferrous sulfate 325 (65 FE) MG tablet Take 1 tablet (325 mg total) by mouth 2  (two) times daily with a meal. For ten days starting 3/15  120 tablet  3  . Fluticasone-Salmeterol (ADVAIR) 100-50 MCG/DOSE AEPB Inhale 1 puff into the lungs every 12 (twelve) hours.      Marland Kitchen levothyroxine (SYNTHROID, LEVOTHROID) 137 MCG tablet Take 1 tablet (137 mcg total) by mouth daily.  30 tablet  0  . UNABLE TO FIND Ms. Lori Murphy was admitted to the [facility] from 08/03/2011 to 08/06/2011 for evaluation and treatment of a medical illness. She can return to work on 08/10/2011.  Please feel free to contact us for any further information.  [facility] Phone number: [phone]  1 Units  0   Allergies  Allergen Reactions  . Codeine Anaphylaxis and Swelling  . Food Anaphylaxis    Milk, coconut, mushrooms, seafood  . Penicillins Anaphylaxis and Swelling  . Cholestatin Other (See Comments)    Exacerbates asthma  . Other Itching    Pet hair    ROS: Pertinent items in HPI  OBJECTIVE Blood pressure 113/76, pulse 83, temperature 98.4 F (36.9 C), temperature source Oral, height 5\' 3"  (1.6 m), weight 188 lb 3.2 oz (85.367 kg), last menstrual period 07/23/2011.  GENERAL: Well-developed, well-nourished female in no acute distress.  HEENT: Normocephalic, good dentition HEART: normal rate RESP: normal effort ABDOMEN: Soft, nontender EXTREMITIES: Nontender, no edema       ASSESSMENT AUB with endometrial polyp  PLAN Dr. Debroah Loop spoke with pt and explained rcommendation for D&C/hysteroscopy and Novasure ablation. Patient elects to proceed and procedure is scheduled for 10/01/2011.     Lori Murphy 09/26/2011 2:57 PM

## 2011-09-30 ENCOUNTER — Encounter (HOSPITAL_COMMUNITY): Payer: Self-pay | Admitting: *Deleted

## 2011-10-01 ENCOUNTER — Ambulatory Visit (HOSPITAL_BASED_OUTPATIENT_CLINIC_OR_DEPARTMENT_OTHER): Payer: Medicaid Other | Admitting: Oncology

## 2011-10-01 ENCOUNTER — Encounter (HOSPITAL_COMMUNITY)
Admission: RE | Disposition: A | Discharge status: Home / Self Care | Payer: Self-pay | Source: Ambulatory Visit | Attending: Obstetrics & Gynecology

## 2011-10-01 ENCOUNTER — Other Ambulatory Visit (HOSPITAL_BASED_OUTPATIENT_CLINIC_OR_DEPARTMENT_OTHER): Payer: Medicaid Other | Admitting: Lab

## 2011-10-01 ENCOUNTER — Encounter (HOSPITAL_COMMUNITY): Payer: Self-pay | Admitting: *Deleted

## 2011-10-01 ENCOUNTER — Encounter (HOSPITAL_COMMUNITY): Payer: Self-pay | Admitting: Anesthesiology

## 2011-10-01 ENCOUNTER — Ambulatory Visit (HOSPITAL_COMMUNITY): Payer: Medicaid Other | Admitting: Anesthesiology

## 2011-10-01 ENCOUNTER — Ambulatory Visit (HOSPITAL_COMMUNITY)
Admission: RE | Admit: 2011-10-01 | Discharge: 2011-10-01 | Disposition: A | Discharge status: Home / Self Care | Payer: Medicaid Other | Source: Ambulatory Visit | Attending: Obstetrics & Gynecology | Admitting: Obstetrics & Gynecology

## 2011-10-01 ENCOUNTER — Telehealth: Payer: Self-pay | Admitting: Oncology

## 2011-10-01 ENCOUNTER — Encounter: Payer: Self-pay | Admitting: Oncology

## 2011-10-01 VITALS — BP 119/81 | HR 69 | Temp 97.7°F | Ht 63.0 in | Wt 192.7 lb

## 2011-10-01 DIAGNOSIS — N920 Excessive and frequent menstruation with regular cycle: Secondary | ICD-10-CM

## 2011-10-01 DIAGNOSIS — R142 Eructation: Secondary | ICD-10-CM

## 2011-10-01 DIAGNOSIS — N840 Polyp of corpus uteri: Secondary | ICD-10-CM | POA: Insufficient documentation

## 2011-10-01 DIAGNOSIS — N938 Other specified abnormal uterine and vaginal bleeding: Secondary | ICD-10-CM | POA: Insufficient documentation

## 2011-10-01 DIAGNOSIS — D649 Anemia, unspecified: Secondary | ICD-10-CM

## 2011-10-01 DIAGNOSIS — N949 Unspecified condition associated with female genital organs and menstrual cycle: Secondary | ICD-10-CM

## 2011-10-01 DIAGNOSIS — N289 Disorder of kidney and ureter, unspecified: Secondary | ICD-10-CM

## 2011-10-01 HISTORY — PX: CERVICAL POLYPECTOMY: SHX88

## 2011-10-01 LAB — CBC WITH DIFFERENTIAL/PLATELET
Basophils Absolute: 0.1 10*3/uL (ref 0.0–0.1)
Eosinophils Absolute: 0.3 10*3/uL (ref 0.0–0.5)
HGB: 8.5 g/dL — ABNORMAL LOW (ref 11.6–15.9)
MCV: 77.7 fL — ABNORMAL LOW (ref 79.5–101.0)
MONO%: 4.7 % (ref 0.0–14.0)
NEUT#: 3.6 10*3/uL (ref 1.5–6.5)
RDW: 20.9 % — ABNORMAL HIGH (ref 11.2–14.5)
lymph#: 1.2 10*3/uL (ref 0.9–3.3)

## 2011-10-01 LAB — IRON AND TIBC: Iron: 21 ug/dL — ABNORMAL LOW (ref 42–145)

## 2011-10-01 LAB — FERRITIN: Ferritin: 3 ng/mL — ABNORMAL LOW (ref 10–291)

## 2011-10-01 LAB — COMPREHENSIVE METABOLIC PANEL
Albumin: 3.8 g/dL (ref 3.5–5.2)
CO2: 25 mEq/L (ref 19–32)
Calcium: 8.4 mg/dL (ref 8.4–10.5)
Glucose, Bld: 75 mg/dL (ref 70–99)
Potassium: 4.3 mEq/L (ref 3.5–5.3)
Sodium: 137 mEq/L (ref 135–145)
Total Protein: 6.2 g/dL (ref 6.0–8.3)

## 2011-10-01 SURGERY — DILATATION & CURETTAGE/HYSTEROSCOPY WITH NOVASURE ABLATION
Anesthesia: General | Site: Vagina | Wound class: Clean Contaminated

## 2011-10-01 MED ORDER — LACTATED RINGERS IV SOLN
INTRAVENOUS | Status: DC
  Administered 2011-10-01: 15:00:00 via INTRAVENOUS

## 2011-10-01 MED ORDER — PROMETHAZINE HCL 25 MG/ML IJ SOLN: 6.2500 mg | INTRAMUSCULAR | Status: DC | PRN

## 2011-10-01 MED ORDER — FENTANYL CITRATE 0.05 MG/ML IJ SOLN
INTRAMUSCULAR | Status: AC
  Filled 2011-10-01: qty 2

## 2011-10-01 MED ORDER — ONDANSETRON HCL 4 MG/2ML IJ SOLN
INTRAMUSCULAR | Status: AC
  Filled 2011-10-01: qty 2

## 2011-10-01 MED ORDER — KETOROLAC TROMETHAMINE 30 MG/ML IJ SOLN: 15.0000 mg | Freq: Once | INTRAMUSCULAR | Status: DC | PRN

## 2011-10-01 MED ORDER — MIDAZOLAM HCL 2 MG/2ML IJ SOLN: 0.5000 mg | Freq: Once | INTRAMUSCULAR | Status: DC | PRN

## 2011-10-01 MED ORDER — DEXAMETHASONE SODIUM PHOSPHATE 10 MG/ML IJ SOLN
INTRAMUSCULAR | Status: AC
  Filled 2011-10-01: qty 1

## 2011-10-01 MED ORDER — ACETAMINOPHEN 325 MG PO TABS: 325.0000 mg | ORAL_TABLET | ORAL | Status: DC | PRN

## 2011-10-01 MED ORDER — MIDAZOLAM HCL 2 MG/2ML IJ SOLN
INTRAMUSCULAR | Status: AC
  Filled 2011-10-01: qty 2

## 2011-10-01 MED ORDER — PROPOFOL 10 MG/ML IV EMUL
INTRAVENOUS | Status: DC | PRN
  Administered 2011-10-01: 150 mg via INTRAVENOUS

## 2011-10-01 MED ORDER — GLYCOPYRROLATE 0.2 MG/ML IJ SOLN
INTRAMUSCULAR | Status: AC
  Filled 2011-10-01: qty 1

## 2011-10-01 MED ORDER — LIDOCAINE HCL (CARDIAC) 20 MG/ML IV SOLN
INTRAVENOUS | Status: AC
  Filled 2011-10-01: qty 5

## 2011-10-01 MED ORDER — PROPOFOL 10 MG/ML IV EMUL
INTRAVENOUS | Status: AC
  Filled 2011-10-01: qty 20

## 2011-10-01 MED ORDER — DEXAMETHASONE SODIUM PHOSPHATE 4 MG/ML IJ SOLN
INTRAMUSCULAR | Status: DC | PRN
  Administered 2011-10-01: 10 mg via INTRAVENOUS

## 2011-10-01 MED ORDER — ONDANSETRON HCL 4 MG/2ML IJ SOLN
INTRAMUSCULAR | Status: DC | PRN
  Administered 2011-10-01: 4 mg via INTRAVENOUS

## 2011-10-01 MED ORDER — HYDROMORPHONE HCL 2 MG PO TABS
ORAL_TABLET | ORAL | Status: AC
  Filled 2011-10-01: qty 1

## 2011-10-01 MED ORDER — FENTANYL CITRATE 0.05 MG/ML IJ SOLN
INTRAMUSCULAR | Status: AC
Start: 2011-10-01 — End: 2011-10-01
  Administered 2011-10-01: 50 ug via INTRAVENOUS
  Filled 2011-10-01: qty 2

## 2011-10-01 MED ORDER — BUPIVACAINE-EPINEPHRINE 0.5% -1:200000 IJ SOLN
INTRAMUSCULAR | Status: DC | PRN
  Administered 2011-10-01: 10 mL

## 2011-10-01 MED ORDER — FENTANYL CITRATE 0.05 MG/ML IJ SOLN
INTRAMUSCULAR | Status: DC | PRN
  Administered 2011-10-01: 100 ug via INTRAVENOUS

## 2011-10-01 MED ORDER — MEPERIDINE HCL 25 MG/ML IJ SOLN: 6.2500 mg | INTRAMUSCULAR | Status: DC | PRN

## 2011-10-01 MED ORDER — LACTATED RINGERS IR SOLN
Status: DC | PRN
  Administered 2011-10-01: 3000 mL

## 2011-10-01 MED ORDER — FENTANYL CITRATE 0.05 MG/ML IJ SOLN
25.0000 ug | INTRAMUSCULAR | Status: DC | PRN
  Administered 2011-10-01: 25 ug via INTRAVENOUS
  Administered 2011-10-01: 50 ug via INTRAVENOUS

## 2011-10-01 MED ORDER — KETOROLAC TROMETHAMINE 30 MG/ML IJ SOLN
INTRAMUSCULAR | Status: DC | PRN
  Administered 2011-10-01: 30 mg via INTRAVENOUS

## 2011-10-01 MED ORDER — LIDOCAINE HCL (CARDIAC) 20 MG/ML IV SOLN
INTRAVENOUS | Status: DC | PRN
  Administered 2011-10-01: 60 mg via INTRAVENOUS

## 2011-10-01 MED ORDER — BUPIVACAINE-EPINEPHRINE (PF) 0.5% -1:200000 IJ SOLN
INTRAMUSCULAR | Status: AC
  Filled 2011-10-01: qty 10

## 2011-10-01 MED ORDER — MIDAZOLAM HCL 5 MG/5ML IJ SOLN
INTRAMUSCULAR | Status: DC | PRN
  Administered 2011-10-01: 2 mg via INTRAVENOUS

## 2011-10-01 MED ORDER — DICLOFENAC SODIUM 75 MG PO TBEC: 75.0000 mg | DELAYED_RELEASE_TABLET | Freq: Two times a day (BID) | ORAL | Status: DC

## 2011-10-01 MED ORDER — HYDROMORPHONE HCL 2 MG PO TABS
2.0000 mg | ORAL_TABLET | Freq: Once | ORAL | Status: AC
  Administered 2011-10-01: 2 mg via ORAL

## 2011-10-01 SURGICAL SUPPLY — 12 items
ABLATOR ENDOMETRIAL BIPOLAR (ABLATOR) ×3 IMPLANT
CATH ROBINSON RED A/P 16FR (CATHETERS) ×3 IMPLANT
CLOTH BEACON ORANGE TIMEOUT ST (SAFETY) ×3 IMPLANT
CONTAINER PREFILL 10% NBF 60ML (FORM) ×6 IMPLANT
GLOVE BIO SURGEON STRL SZ 6.5 (GLOVE) ×3 IMPLANT
GLOVE BIOGEL PI IND STRL 7.0 (GLOVE) ×4 IMPLANT
GLOVE BIOGEL PI INDICATOR 7.0 (GLOVE) ×2
GOWN PREVENTION PLUS LG XLONG (DISPOSABLE) ×6 IMPLANT
GOWN STRL REIN XL XLG (GOWN DISPOSABLE) IMPLANT
PACK HYSTEROSCOPY LF (CUSTOM PROCEDURE TRAY) ×3 IMPLANT
TOWEL OR 17X24 6PK STRL BLUE (TOWEL DISPOSABLE) ×6 IMPLANT
WATER STERILE IRR 1000ML POUR (IV SOLUTION) IMPLANT

## 2011-10-01 NOTE — Anesthesia Postprocedure Evaluation (Signed)
Anesthesia Post Note  Patient: Lori Murphy  Procedure(s) Performed: Procedure(s) (LRB): DILATATION & CURETTAGE/HYSTEROSCOPY WITH NOVASURE ABLATION (N/A) CERVICAL POLYPECTOMY (N/A)  Anesthesia type: GA  Patient location: PACU  Post pain: Pain level controlled  Post assessment: Post-op Vital signs reviewed  Last Vitals:  Filed Vitals:   10/01/11 1634  BP:   Pulse: 70  Temp:   Resp: 16    Post vital signs: Reviewed  Level of consciousness: sedated  Complications: No apparent anesthesia complications

## 2011-10-01 NOTE — Discharge Instructions (Signed)
Endometrial Ablation Endometrial ablation removes the lining of the uterus (endometrium). It is usually a same day, outpatient treatment. Ablation helps avoid major surgery (such as a hysterectomy). A hysterectomy is removal of the cervix and uterus. Endometrial ablation has less risk and complications, has a shorter recovery period and is less expensive. After endometrial ablation, most women will have little or no menstrual bleeding. You may not keep your fertility. Pregnancy is no longer likely after this procedure but if you are pre-menopausal, you still need to use a reliable method of birth control following the procedure because pregnancy can occur. REASONS TO HAVE THE PROCEDURE MAY INCLUDE:  Heavy periods.   Bleeding that is causing anemia.   Anovulatory bleeding, very irregular, bleeding.   Bleeding submucous fibroids (on the lining inside the uterus) if they are smaller than 3 centimeters.  REASONS NOT TO HAVE THE PROCEDURE MAY INCLUDE:  You wish to have more children.   You have a pre-cancerous or cancerous problem. The cause of any abnormal bleeding must be diagnosed before having the procedure.   You have pain coming from the uterus.   You have a submucus fibroid larger than 3 centimeters.   You recently had a baby.   You recently had an infection in the uterus.   You have a severe retro-flexed, tipped uterus and cannot insert the instrument to do the ablation.   You had a Cesarean section or deep major surgery on the uterus.   The inner cavity of the uterus is too large for the endometrial ablation instrument.  RISKS AND COMPLICATIONS   Perforation of the uterus.   Bleeding.   Infection of the uterus, bladder or vagina.   Injury to surrounding organs.   Cutting the cervix.   An air bubble to the lung (air embolus).   Pregnancy following the procedure.   Failure of the procedure to help the problem requiring hysterectomy.   Decreased ability to diagnose  cancer in the lining of the uterus.  BEFORE THE PROCEDURE  The lining of the uterus must be tested to make sure there is no pre-cancerous or cancer cells present.   Medications may be given to make the lining of the uterus thinner.   Ultrasound may be used to evaluate the size and look for abnormalities of the uterus.   Future pregnancy is not desired.  PROCEDURE  There are different ways to destroy the lining of the uterus.   Resectoscope - radio frequency-alternating electric current is the most common one used.   Cryotherapy - freezing the lining of the uterus.   Heated Free Liquid - heated salt (saline) solution inserted into the uterus.   Microwave - uses high energy microwaves in the uterus.   Thermal Balloon - a catheter with a balloon tip is inserted into the uterus and filled with heated fluid.  Your caregiver will talk with you about the method used in this clinic. They will also instruct you on the pros and cons of the procedure. Endometrial ablation is performed along with a procedure called operative hysteroscopy. A narrow viewing tube is inserted through the birth canal (vagina) and through the cervix into the uterus. A tiny camera attached to the viewing tube (hysteroscope) allows the uterine cavity to be shown on a TV monitor during surgery. Your uterus is filled with a harmless liquid to make the procedure easier. The lining of the uterus is then removed. The lining can also be removed with a resectoscope which allows your surgeon   to cut away the lining of the uterus under direct vision. Usually, you will be able to go home within an hour after the procedure. HOME CARE INSTRUCTIONS   Do not drive for 24 hours.   No tampons, douching or intercourse for 2 weeks or until your caregiver approves.   Rest at home for 24 to 48 hours. You may then resume normal activities unless told differently by your caregiver.   Take your temperature two times a day for 4 days, and record  it.   Take any medications your caregiver has ordered, as directed.   Use some form of contraception if you are pre-menopausal and do not want to get pregnant.  Bleeding after the procedure is normal. It varies from light spotting and mildly watery to bloody discharge for 4 to 6 weeks. You may also have mild cramping. Only take over-the-counter or prescription medicines for pain, discomfort, or fever as directed by your caregiver. Do not use aspirin, as this may aggravate bleeding. Frequent urination during the first 24 hours is normal. You will not know how effective your surgery is until at least 3 months after the surgery. SEEK IMMEDIATE MEDICAL CARE IF:   Bleeding is heavier than a normal menstrual cycle.   An oral temperature above 102 F (38.9 C) develops.   You have increasing cramps or pains not relieved with medication or develop belly (abdominal) pain which does not seem to be related to the same area of earlier cramping and pain.   You are light headed, weak or have fainting episodes.   You develop pain in the shoulder strap areas.   You have chest or leg pain.   You have abnormal vaginal discharge.   You have painful urination.  Document Released: 03/13/2004 Document Revised: 04/23/2011 Document Reviewed: 06/11/2007 Exit[employer] Patient Information 2012 [employer]. 

## 2011-10-01 NOTE — Anesthesia Procedure Notes (Signed)
Procedure Name: LMA Insertion Date/Time: 10/01/2011 3:15 PM Performed by: Graciela Husbands Pre-anesthesia Checklist: Suction available, Emergency Drugs available, Timeout performed, Patient being monitored and Patient identified Patient Re-evaluated:Patient Re-evaluated prior to inductionOxygen Delivery Method: Circle system utilized Preoxygenation: Pre-oxygenation with 100% oxygen Intubation Type: IV induction LMA: LMA inserted LMA Size: 4.0 Number of attempts: 1 Placement Confirmation: breath sounds checked- equal and bilateral and positive ETCO2 Tube secured with: Tape

## 2011-10-01 NOTE — Op Note (Signed)
Procedure: Hysteroscopy and endometrial ablation with NovaSure Preoperative diagnosis: Dysfunctional uterine bleeding and possible endometrial polyp Postoperative diagnosis: Dysfunctional uterine bleeding Surgeon: Dr. Scheryl Darter Specimen: None Anesthesia: Gen. endotracheal Complications: None Drains: None Estimated blood loss: Negligible Counts: Correct  Patient gave written consent for hysteroscopy possible polypectomy and endometrial ablation with NovaSure. Patient identification was confirmed and she was brought to the operating room and general anesthesia was induced. She was placed in dorsal lithotomy position. Perineum and vagina were sterilely prepped and draped. Bladder was drained with a Foley catheter. Exam revealed normal-size uterus no adnexal masses. Speculum was inserted and half percent Marcaine with 1 200,000 epinephrine was infiltrated is an intracervical block. Cervix was dilated. Cervical length was 4.5 cm and the cavity length was 5.5 cm. Hysteroscope was inserted with 1.5% glycine used as the distending medium and video camera was used. Panoramic view was obtained. Both tubal ostia were seen. There was some mild thickening but no focal lesions. Elected to proceed with ablation. Hysteroscope was removed. The NovaSure was inserted and the cavity width was 4.6 cm. Treatment cycle lasting 1 minute 56 seconds was completed without problems. All instruments were removed. Good hemostasis was assured. Patient tolerated the procedure well without complications. She was brought in stable condition to the PACU.  Lori Murphy 10/01/2011 3:53 PM

## 2011-10-01 NOTE — Telephone Encounter (Signed)
Called [facility], they need referral from primary care in [place], pt will call primary doc and ask them to do referral, gave pt appt for August 2013 lab and MD

## 2011-10-01 NOTE — Anesthesia Preprocedure Evaluation (Signed)
Anesthesia Evaluation  Patient identified by MRN, date of birth, ID band Patient awake    Reviewed: Allergy & Precautions, H&P , Patient's Chart, lab work & pertinent test results, reviewed documented beta blocker date and time   History of Anesthesia Complications Negative for: history of anesthetic complications  Airway Mallampati: III TM Distance: >3 FB Neck ROM: full    Dental No notable dental hx.    Pulmonary neg pulmonary ROS, asthma ,  breath sounds clear to auscultation  Pulmonary exam normal       Cardiovascular Exercise Tolerance: Good negative cardio ROS  Rhythm:regular Rate:Normal     Neuro/Psych  Headaches, negative neurological ROS  negative psych ROS   GI/Hepatic negative GI ROS, Neg liver ROS,   Endo/Other  negative endocrine ROS  Renal/GU negative Renal ROS     Musculoskeletal   Abdominal   Peds  Hematology negative hematology ROS (+)   Anesthesia Other Findings Thyroid disease   graves disease-radiation DUB (dysfunctional uterine bleeding)        PID (acute pelvic inflammatory disease)     Asthma        Lung disease     Anemia   microcytic hypochromic anemia-iron infusion at cancer center    Renal disorder   cysts/lesions on kidney    Reproductive/Obstetrics negative OB ROS                           Anesthesia Physical Anesthesia Plan  ASA: III  Anesthesia Plan: General LMA   Post-op Pain Management:    Induction:   Airway Management Planned:   Additional Equipment:   Intra-op Plan:   Post-operative Plan:   Informed Consent: I have reviewed the patients History and Physical, chart, labs and discussed the procedure including the risks, benefits and alternatives for the proposed anesthesia with the patient or authorized representative who has indicated his/her understanding and acceptance.   Dental Advisory Given  Plan Discussed with: CRNA, Surgeon and  Anesthesiologist  Anesthesia Plan Comments:         Anesthesia Quick Evaluation

## 2011-10-01 NOTE — H&P (Signed)
Subjective:       Patient ID: Lori Murphy, female    DOB: Jun 13, 1975, 36 y.o.   MRN: [id number]   [id number] Patient's last menstrual period was 09/22/2011. Patient has history of dysfunctional uterine bleeding and heavy periods. She was admitted in March 2013 at [facility] with pelvic pain and dysfunctional bleeding. She was anemic and received transfusion. She was treated for pelvic inflammatory disease. Patient was referred to gynecology clinic for followup. Says she bleed several times a month and often heavy. An ultrasound was done while she was hospitalized but she says she does not know the results. Past Medical History   Diagnosis  Date   .  Renal disorder     .  Thyroid disease     .  DUB (dysfunctional uterine bleeding)     .  PID (acute pelvic inflammatory disease)     .  Asthma     .  Lung disease     .  Anemia         Past Surgical History   Procedure  Date   .  Cholecystectomy     .  Tubal ligation     .  Thyriod radiation     .  Multiple tooth extractions         6 teeth       Current Outpatient Prescriptions on File Prior to Visit   Medication  Sig  Dispense  Refill   .  acetaminophen (TYLENOL) 325 MG tablet  Take 2 tablets (650 mg total) by mouth every 6 (six) hours as needed for pain or fever (or Fever >/= 101).         Marland Kitchen  albuterol (PROVENTIL HFA;VENTOLIN HFA) 108 (90 BASE) MCG/ACT inhaler  Inhale 2 puffs into the lungs every 6 (six) hours as needed. For shortness of breath         .  Dextromethorphan Polistirex (DELSYM PO)  Take by mouth as needed.         .  ferrous sulfate 325 (65 FE) MG tablet  Take 1 tablet (325 mg total) by mouth 2 (two) times daily with a meal. For ten days starting 3/15   120 tablet   3   .  Fluticasone-Salmeterol (ADVAIR) 100-50 MCG/DOSE AEPB  Inhale 1 puff into the lungs every 12 (twelve) hours.         Marland Kitchen  levothyroxine (SYNTHROID, LEVOTHROID) 137 MCG tablet  Take 1 tablet (137 mcg total) by mouth daily.   30  tablet   0   .  UNABLE TO FIND  Ms. Lori Murphy was admitted to the [facility] from 08/03/2011 to 08/06/2011 for evaluation and treatment of a medical illness. She can return to work on 08/10/2011.    Please feel free to contact us for any further information.      Allergies   Allergen  Reactions   .  Codeine  Anaphylaxis and Swelling   .  Food  Anaphylaxis       Milk, coconut, mushrooms, seafood   .  Penicillins  Anaphylaxis and Swelling   .  Other  Itching       Pet hair   .  Seasonal  Other (See Comments)       Exacerbates asthma       History       Social History   .  Marital Status:  Single       Spouse Name:  N/A       Number of Children:  N/A   .  Years of Education:  N/A       Occupational History   .  Not on file.       Social History Main Topics   .  Smoking status:  Former Games developer   .  Smokeless tobacco:  Never Used   .  Alcohol Use:  No         occasional  mixed drink   .  Drug Use:  No   .  Sexually Active:  Yes       Birth Control/ Protection:  None       Other Topics  Concern   .  Not on file       Social History Narrative   .  No narrative on file       Family History   Problem  Relation  Age of Onset   .  COPD  Father     .  Heart disease  Father                Review of Systems  Constitutional: Negative.   Respiratory: Negative.   Genitourinary: Negative for dysuria, frequency, vaginal bleeding and difficulty urinating.        Vaginal dryness  Neurological: Negative for dizziness.          Objective:     Physical Exam  Constitutional: She is oriented to person, place, and time. She appears well-developed and well-nourished. No distress.  Neck: Normal range of motion.  Pulmonary/Chest: Effort normal.  Abdominal: Soft. She exhibits no distension and no mass. There is no tenderness.  Genitourinary: Vagina normal and uterus normal.       Mild tenderness  Neurological: She is alert and oriented to person,  place, and time.  Skin: Skin is warm and dry.  Psychiatric: She has a normal mood and affect.     Filed Vitals:     09/03/11 1345   BP:  123/77   Pulse:  77   Temp:  98.5 F (36.9 C)   TempSrc:  Oral   Height:  5\' 3"  (1.6 m)   Weight:  183 lb 3.2 oz (83.099 kg)   Filed Vitals:   10/01/11 1423  BP: 109/78  Pulse: 75  Temp: 98.6 F (37 C)  TempSrc: Oral  Resp: 18  SpO2: 100%     No acute distress normal affect      *RADIOLOGY REPORT*    Clinical Data: Abdominal pain. Concern for tubo-ovarian abscess   TRANSABDOMINAL AND TRANSVAGINAL ULTRASOUND OF PELVIS   Technique: Both transabdominal and transvaginal ultrasound   examinations of the pelvis were performed. Transabdominal technique   was performed for global imaging of the pelvis including uterus,   ovaries, adnexal regions, and pelvic cul-de-sac.   Comparison: CT same date   It was necessary to proceed with endovaginal exam following the   transabdominal exam to visualize the endometrium.   Findings:   Uterus: Anteverted, anteflexed. 10.5 x 6.6 x 6.0 cm. No focal   myometrial abnormality.   Endometrium: Distended by fluid/debris, with endometrial canal   echogenic filling defect measuring 1.7 cm maximally. Adjacent   endometrial thickness measures 4 mm.   Right ovary: 3.2 x 1.5 x 1.0 cm. Normal.   Left ovary: 2.4 x 1.5 x 1.3 cm. Normal.   Other findings: Trace free fluid noted in the cul-de-sac.   IMPRESSION:   Distention of the endometrial  canal body fluid/debris, with   echogenic mass like filling defect, most likely polyp, although   endometrial hyperplasia or neoplasia could have a similar   appearance. Endometritis is not excluded, if there has been recent   instrumentation. Hysteroscopic evaluation is recommended with   sampling directed towards the anterior endometrial canal.   The ovaries are normal bilaterally without evidence of tubal   ovarian abscess.   Original Report Authenticated By: Harrel Lemon, M.D.           Pap test and an endometrial biopsy was obtained with pink tissue seen. Uterus sounded to 8 cm.  Pap and biopsy normal.       Assessment & Plan:    Dysfunctional uterine bleeding and endometrial polyp identified on ultrasound. Patient will apply for financial assistance. I offered hysteroscopy removal of endometrial polyp and endometrial ablation.  The procedure is scheduled. The risk of continued bleeding problem , uterine damage or infection, pelvic organ damage, pain, anesthesia were discussed and her questions were answered.   Dr. Scheryl Darter 10/01/2011 2:52 PM

## 2011-10-01 NOTE — Transfer of Care (Signed)
Immediate Anesthesia Transfer of Care Note  Patient: Lori Murphy  Procedure(s) Performed: Procedure(s) (LRB): DILATATION & CURETTAGE/HYSTEROSCOPY WITH NOVASURE ABLATION (N/A) CERVICAL POLYPECTOMY (N/A)  Patient Location: PACU  Anesthesia Type: General  Level of Consciousness: awake, alert  and oriented  Airway & Oxygen Therapy: Patient Spontanous Breathing and Patient connected to nasal cannula oxygen  Post-op Assessment: Report given to PACU RN and Post -op Vital signs reviewed and stable  Post vital signs: Reviewed and stable  Complications: No apparent anesthesia complications

## 2011-10-01 NOTE — Progress Notes (Signed)
Hematology and Oncology Follow Up Visit  Lori Murphy [id number] 1976-03-04 36 y.o. 10/01/2011 1:45 PM Quentin Mulling, MD, MDHooper, Lacretia Leigh, MD   Principle Diagnosis: Iron deficiency anemia due do heavy menstrual bleeding. The patient likely has a component of anemia due to renal insufficiency.  Current therapy: Ferrous sulfate 325 mg PO BID  Interim History: Lori Murphy is a 36 year old female seen today for follow-up of anemia. She has been taking oral iron twice a day since her last visit. She reports some GI distress with the iron including nausea, but no vomiting. She reports bloating and loose stools which pre-dates the start of the oral iron. She continues to have heavy menstrual bleeding lasting anywhere from 7-21 days. Denies hemoptysis, hematemesis, melena, and hematuria. Denies chest pain, shortness of breath, and dyspnea. She is still craving ice. She is scheduled for a D&C/hysteroscopy and ablation later today.  Medications: I have reviewed the patient's current medications. Current outpatient prescriptions:acetaminophen (TYLENOL) 325 MG tablet, Take 2 tablets (650 mg total) by mouth every 6 (six) hours as needed for pain or fever (or Fever >/= 101)., Disp: , Rfl: ;  albuterol (PROVENTIL HFA;VENTOLIN HFA) 108 (90 BASE) MCG/ACT inhaler, Inhale 2 puffs into the lungs every 6 (six) hours as needed. For shortness of breath, Disp: , Rfl:  butalbital-acetaminophen-caffeine (FIORICET) 50-325-40 MG per tablet, Take 1 tablet by mouth 2 (two) times daily as needed for headache., Disp: 14 tablet, Rfl: 0;  Dextromethorphan Polistirex (DELSYM PO), Take by mouth as needed., Disp: , Rfl: ;  ferrous sulfate 325 (65 FE) MG tablet, Take 1 tablet (325 mg total) by mouth 2 (two) times daily with a meal. For ten days starting 3/15, Disp: 120 tablet, Rfl: 3 Fluticasone-Salmeterol (ADVAIR) 100-50 MCG/DOSE AEPB, Inhale 1 puff into the lungs every 12 (twelve) hours., Disp: , Rfl: ;  levothyroxine  (SYNTHROID, LEVOTHROID) 137 MCG tablet, Take 1 tablet (137 mcg total) by mouth daily., Disp: 30 tablet, Rfl: 0;  medroxyPROGESTERone (PROVERA) 10 MG tablet, Take 1 tablet (10 mg total) by mouth daily., Disp: 30 tablet, Rfl: 1  Allergies:  Allergies  Allergen Reactions  . Codeine Anaphylaxis and Swelling  . Food Anaphylaxis    Milk, coconut, mushrooms, seafood  . Penicillins Anaphylaxis and Swelling  . Cholestatin Other (See Comments)    Exacerbates asthma  . Other Itching    Pet hair    Past Medical History, Surgical history, Social history, and Family History were reviewed and updated.  Review of Systems: Constitutional:  Negative for fever, chills, night sweats, anorexia, weight loss, pain. Cardiovascular: no chest pain or dyspnea on exertion Respiratory: no cough, shortness of breath, or wheezing Neurological: no TIA or stroke symptoms Dermatological: negative ENT: negative Skin: Negative. Gastrointestinal: reports bloating after eating. Loose stools that are watery to semi-formed.  Genito-Urinary: no dysuria, trouble voiding, or hematuria Hematological and Lymphatic: negative Breast: negative for breast lumps Musculoskeletal: negative Remaining ROS negative.  Physical Exam: Blood pressure 119/81, pulse 69, temperature 97.7 F (36.5 C), temperature source Oral, height 5\' 3"  (1.6 m), weight 192 lb 11.2 oz (87.408 kg), last menstrual period 09/22/2011. ECOG: 0 General appearance: alert, cooperative and no distress Head: Normocephalic, without obvious abnormality, atraumatic Neck: no adenopathy, no carotid bruit, no JVD, supple, symmetrical, trachea midline and thyroid not enlarged, symmetric, no tenderness/mass/nodules Lymph nodes: Cervical, supraclavicular, and axillary nodes normal. Heart:regular rate and rhythm, S1, S2 normal, no murmur, click, rub or gallop Lung:chest clear, no wheezing, rales, normal symmetric air entry Abdomen:  soft, non-tender, without masses or  organomegaly EXT:no erythema, induration, or nodules  Lab Results: Lab Results  Component Value Date   WBC 5.4 10/01/2011   HGB 8.5* 10/01/2011   HCT 27.0* 10/01/2011   MCV 77.7* 10/01/2011   PLT 166 10/01/2011     Chemistry      Component Value Date/Time   NA 142 08/20/2011 1023   K 4.3 08/20/2011 1023   CL 110 08/20/2011 1023   CO2 24 08/20/2011 1023   BUN 20 08/20/2011 1023   CREATININE 2.01* 08/20/2011 1023      Component Value Date/Time   CALCIUM 8.1* 08/20/2011 1023   ALKPHOS 41 08/20/2011 1023   AST 15 08/20/2011 1023   ALT <8 08/20/2011 1023   BILITOT 0.4 08/20/2011 1023     Radiological Studies: No results found.   Impression and Plan: This is a 36 year old female with the following issues: 1. Anemia. Primarily due to iron deficiency, but likely has a component of iron deficiency. Difficult to know if oral iron is working as she is still bleeding heavily. Reviewed treatment options with the patient. Gave her the option of continuing oral iron twice a day versus IV Feraheme. Discussed that if she received IV iron, it might work quicker and would eliminate any GI toxicities. The patient does not want IV iron as she has a lot going on and does not have time to come in for the infusion at this time. She would like to continue oral iron for now. I have encouraged her to continue oral iron twice a day and add Vitamin C daily. 2. 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 Creatinine 2.0 in April 2013.  3. Heavy menstrual bleeding. Patient having ablation procedure later today. Hopefully anemia will improve once bleeding stopped. 4. Abdominal bloating. Patient reports bloating and watery stools that pre-date the oral iron. Will refer her to GI per her request. 5. Follow-up. In 3 months with repeat labs at that time.  Spent more than half the time coordinating care.    Lori Murphy 5/16/20131:45 PM

## 2011-10-02 ENCOUNTER — Telehealth: Payer: Self-pay | Admitting: *Deleted

## 2011-10-02 ENCOUNTER — Other Ambulatory Visit: Payer: Self-pay | Admitting: Oncology

## 2011-10-02 ENCOUNTER — Encounter: Payer: Self-pay | Admitting: *Deleted

## 2011-10-02 ENCOUNTER — Telehealth: Payer: Self-pay | Admitting: Oncology

## 2011-10-02 NOTE — Telephone Encounter (Signed)
Per staff message from [place], i have schedule the treatment appt. JJWM

## 2011-10-02 NOTE — Telephone Encounter (Signed)
Call to patient to review lab results. Discussed low ferritin and again recommended that she receive IV iron. Patient is agreeable, but cannot do it until week of 6/6. POF generated and Feraheme orders entered.

## 2011-10-02 NOTE — Telephone Encounter (Signed)
sent michelle an email to set up the patient  for treatment on 10-19-2011 patient confirmed over the phone the new date and time

## 2011-10-02 NOTE — Telephone Encounter (Signed)
Per staff message from Jacki Cones, I have scheduled treatment appt for the patiet to follow MD visit. JMW

## 2011-10-03 ENCOUNTER — Encounter (HOSPITAL_COMMUNITY): Payer: Self-pay | Admitting: Obstetrics & Gynecology

## 2011-10-15 ENCOUNTER — Ambulatory Visit (INDEPENDENT_AMBULATORY_CARE_PROVIDER_SITE_OTHER): Payer: Medicaid Other | Admitting: Family Medicine

## 2011-10-15 ENCOUNTER — Encounter: Payer: Self-pay | Admitting: Family Medicine

## 2011-10-15 VITALS — BP 114/73 | HR 82 | Temp 98.2°F | Ht 63.0 in | Wt 194.0 lb

## 2011-10-15 DIAGNOSIS — R109 Unspecified abdominal pain: Secondary | ICD-10-CM

## 2011-10-15 DIAGNOSIS — Z09 Encounter for follow-up examination after completed treatment for conditions other than malignant neoplasm: Secondary | ICD-10-CM

## 2011-10-15 MED ORDER — INDOMETHACIN 50 MG PO CAPS: 50.0000 mg | ORAL_CAPSULE | Freq: Three times a day (TID) | ORAL | Status: AC

## 2011-10-15 NOTE — Progress Notes (Signed)
  Subjective:    Patient ID: Konnor Jorden, female    DOB: [birth year], 36 y.o.   MRN: [id number]  HPI This is a 36 year old female who is 12 days post operative from an endometrial ablation.  She had discharge and discomfort for 4-5 days, which resolved.  She presents with 2 days of sharp lower pelvic pain with foul smelling gray watery discharge.  She denies nausea, vomiting, fever, chills.  She denies frank bleeding.  Voltaren is not working.   Review of Systems     Objective:   Physical Exam  Constitutional: She is oriented to person, place, and time. She appears well-developed and well-nourished.  Abdominal: Soft. She exhibits no distension and no mass. There is no tenderness. There is no rebound and no guarding.  Genitourinary:       Normal cervix and vaginal mucosa.  Greyish discharge.  CMT with pelvic pain.  Neurological: She is alert and oriented to person, place, and time.  Skin: Skin is warm and dry.  Psychiatric: She has a normal mood and affect. Her behavior is normal. Judgment and thought content normal.      Assessment & Plan:  1.  Abdominal pain. Wet prep obtained.  Likely normal postoperative pain.  Discussed that this pain and discharge is normal.  Will change patient in indocin.

## 2011-10-16 ENCOUNTER — Other Ambulatory Visit: Payer: Self-pay | Admitting: Family Medicine

## 2011-10-16 ENCOUNTER — Telehealth (HOSPITAL_COMMUNITY): Payer: Self-pay | Admitting: *Deleted

## 2011-10-16 ENCOUNTER — Other Ambulatory Visit: Payer: Self-pay | Admitting: Oncology

## 2011-10-16 LAB — WET PREP, GENITAL
Trich, Wet Prep: NONE SEEN
Yeast Wet Prep HPF POC: NONE SEEN

## 2011-10-16 MED ORDER — TINIDAZOLE 500 MG PO TABS: 2.0000 g | ORAL_TABLET | Freq: Every day | ORAL | Status: AC

## 2011-10-16 NOTE — Telephone Encounter (Signed)
Patient returned call, notified her of BV.  Instructed patient to pick up her prescription.

## 2011-10-16 NOTE — Telephone Encounter (Signed)
Message copied by Pennie Banter on Fri Oct 16, 2011  1:57 PM ------      Message from: Levie Heritage      Created: Fri Oct 16, 2011  1:14 PM       Patient has BV.  Will treat with tindamax 2g once.  Sent to pharmacy.

## 2011-10-16 NOTE — Telephone Encounter (Signed)
Telephone call to patient regarding BV, patient not in, left message for her to call.  Rx has been sent to her pharmacy, patient just needs to be notified to pick it up.

## 2011-10-19 ENCOUNTER — Other Ambulatory Visit: Payer: Medicaid Other | Admitting: Lab

## 2011-10-19 ENCOUNTER — Ambulatory Visit (HOSPITAL_BASED_OUTPATIENT_CLINIC_OR_DEPARTMENT_OTHER): Payer: Medicaid Other

## 2011-10-19 VITALS — BP 120/79 | HR 65 | Temp 98.4°F

## 2011-10-19 DIAGNOSIS — D649 Anemia, unspecified: Secondary | ICD-10-CM

## 2011-10-19 DIAGNOSIS — D509 Iron deficiency anemia, unspecified: Secondary | ICD-10-CM

## 2011-10-19 MED ORDER — SODIUM CHLORIDE 0.9 % IV SOLN
1020.0000 mg | Freq: Once | INTRAVENOUS | Status: AC
  Administered 2011-10-19: 1020 mg via INTRAVENOUS
  Filled 2011-10-19: qty 34

## 2011-10-19 MED ORDER — SODIUM CHLORIDE 0.9 % IV SOLN
Freq: Once | INTRAVENOUS | Status: AC
  Administered 2011-10-19: 16:00:00 via INTRAVENOUS

## 2011-10-19 NOTE — Patient Instructions (Addendum)
[facility] Discharge Instructions for Patients Receiving Chemotherapy  Today you received feraheme      BELOW ARE SYMPTOMS THAT SHOULD BE REPORTED IMMEDIATELY:  *FEVER GREATER THAN 100.5 F  *CHILLS WITH OR WITHOUT FEVER  NAUSEA AND VOMITING THAT IS NOT CONTROLLED WITH YOUR NAUSEA MEDICATION  *UNUSUAL SHORTNESS OF BREATH  *UNUSUAL BRUISING OR BLEEDING  TENDERNESS IN MOUTH AND THROAT WITH OR WITHOUT PRESENCE OF ULCERS  *URINARY PROBLEMS  *BOWEL PROBLEMS  UNUSUAL RASH Items with * indicate a potential emergency and should be followed up as soon as possible.  . Feel free to call the clinic you have any questions or concerns. The clinic phone number is [phone]. Written information on this medication have been included for you.  I have been informed and understand all the instructions given to me. I know to contact the clinic, my physician, or go to the Emergency Department if any problems should occur. I do not have any questions at this time, but understand that I may call the clinic during office hours   should I have any questions or need assistance in obtaining follow up care.    __________________________________________  _____________  __________ Signature of Patient or Authorized Representative            Date                   Time    __________________________________________ Nurse's Signature

## 2011-11-12 ENCOUNTER — Ambulatory Visit: Payer: Medicaid Other | Admitting: Family

## 2011-12-31 ENCOUNTER — Other Ambulatory Visit: Payer: Medicaid Other | Admitting: Lab

## 2011-12-31 ENCOUNTER — Ambulatory Visit: Payer: Medicaid Other | Admitting: Oncology

## 2012-01-01 ENCOUNTER — Other Ambulatory Visit (HOSPITAL_BASED_OUTPATIENT_CLINIC_OR_DEPARTMENT_OTHER): Payer: Medicaid Other | Admitting: Lab

## 2012-01-01 ENCOUNTER — Telehealth: Payer: Self-pay | Admitting: Oncology

## 2012-01-01 ENCOUNTER — Ambulatory Visit (HOSPITAL_BASED_OUTPATIENT_CLINIC_OR_DEPARTMENT_OTHER): Payer: Medicaid Other | Admitting: Oncology

## 2012-01-01 VITALS — BP 124/86 | HR 69 | Temp 98.0°F | Resp 20 | Ht 63.0 in | Wt 202.4 lb

## 2012-01-01 DIAGNOSIS — N920 Excessive and frequent menstruation with regular cycle: Secondary | ICD-10-CM

## 2012-01-01 DIAGNOSIS — D649 Anemia, unspecified: Secondary | ICD-10-CM

## 2012-01-01 DIAGNOSIS — D509 Iron deficiency anemia, unspecified: Secondary | ICD-10-CM

## 2012-01-01 DIAGNOSIS — N289 Disorder of kidney and ureter, unspecified: Secondary | ICD-10-CM

## 2012-01-01 LAB — CBC WITH DIFFERENTIAL/PLATELET
BASO%: 1 % (ref 0.0–2.0)
EOS%: 5.6 % (ref 0.0–7.0)
HCT: 37.6 % (ref 34.8–46.6)
LYMPH%: 20.8 % (ref 14.0–49.7)
MCH: 28.9 pg (ref 25.1–34.0)
MCHC: 33.8 g/dL (ref 31.5–36.0)
NEUT%: 66.8 % (ref 38.4–76.8)
Platelets: 129 10*3/uL — ABNORMAL LOW (ref 145–400)

## 2012-01-01 LAB — IRON AND TIBC
%SAT: 24 % (ref 20–55)
TIBC: 329 ug/dL (ref 250–470)

## 2012-01-01 LAB — COMPREHENSIVE METABOLIC PANEL
ALT: 14 U/L (ref 0–35)
AST: 32 U/L (ref 0–37)
Creatinine, Ser: 2.48 mg/dL — ABNORMAL HIGH (ref 0.50–1.10)
Total Bilirubin: 0.3 mg/dL (ref 0.3–1.2)

## 2012-01-01 LAB — FERRITIN: Ferritin: 46 ng/mL (ref 10–291)

## 2012-01-01 NOTE — Progress Notes (Signed)
Hematology and Oncology Follow Up Visit  Lori Murphy [id number] Jul 03, 1975 36 y.o. 01/01/2012 3:09 PM HOOPER,JEFFREY C, MDHooper, Lacretia Leigh, MD   Principle Diagnosis: Iron deficiency anemia due do heavy menstrual bleeding. The patient likely has a component of anemia due to renal insufficiency.  Current therapy: Ferrous sulfate 325 mg PO BID. She is S/P IV iron on 10/19/2011 for a total of 1020 mg of Feraheme.   Interim History: Lori Murphy is a 36 year old female seen today for follow-up of anemia. She tolerated IV Fe well and have felt a lot better since taking it. She has been taking oral iron twice a day as well and she is not tolerating it at this time. She reports some GI distress with the iron including nausea, but no vomiting. She reports bloating and loose stools which pre-dates the start of the oral iron. Her menstrual bleeding has improved since the last visit as well after the ablation procedure. Denies hemoptysis, hematemesis, melena, and hematuria. Denies chest pain, shortness of breath, and dyspnea. She is still craving ice. She is scheduled for a   Medications: I have reviewed the patient's current medications. Current outpatient prescriptions:acetaminophen (TYLENOL) 325 MG tablet, Take 2 tablets (650 mg total) by mouth every 6 (six) hours as needed for pain or fever (or Fever >/= 101)., Disp: , Rfl: ;  albuterol (PROVENTIL HFA;VENTOLIN HFA) 108 (90 BASE) MCG/ACT inhaler, Inhale 2 puffs into the lungs every 6 (six) hours as needed. For shortness of breath, Disp: , Rfl: ;  Dextromethorphan Polistirex (DELSYM PO), Take by mouth as needed., Disp: , Rfl:  ferrous sulfate 325 (65 FE) MG tablet, Take 1 tablet (325 mg total) by mouth 2 (two) times daily with a meal. For ten days starting 3/15, Disp: 120 tablet, Rfl: 3;  Fluticasone-Salmeterol (ADVAIR) 100-50 MCG/DOSE AEPB, Inhale 1 puff into the lungs every 12 (twelve) hours., Disp: , Rfl: ;  indomethacin (INDOCIN) 50 MG capsule, Take 1  capsule (50 mg total) by mouth 3 (three) times daily with meals., Disp: 90 capsule, Rfl: 1 levothyroxine (SYNTHROID, LEVOTHROID) 137 MCG tablet, Take 1 tablet (137 mcg total) by mouth daily., Disp: 30 tablet, Rfl: 0  Allergies:  Allergies  Allergen Reactions  . Codeine Anaphylaxis and Swelling  . Food Anaphylaxis    Milk, coconut, mushrooms, seafood  . Penicillins Anaphylaxis and Swelling  . Cholestatin Other (See Comments)    Exacerbates asthma  . Other Itching    Pet hair    Past Medical History, Surgical history, Social history, and Family History were reviewed and updated.  Review of Systems: Constitutional:  Negative for fever, chills, night sweats, anorexia, weight loss, pain. Cardiovascular: no chest pain or dyspnea on exertion Respiratory: no cough, shortness of breath, or wheezing Neurological: no TIA or stroke symptoms Dermatological: negative ENT: negative Skin: Negative. Gastrointestinal: reports bloating after eating. Loose stools that are watery to semi-formed.  Genito-Urinary: no dysuria, trouble voiding, or hematuria Hematological and Lymphatic: negative Breast: negative for breast lumps Musculoskeletal: negative Remaining ROS negative.  Physical Exam: Blood pressure 124/86, pulse 69, temperature 98 F (36.7 C), temperature source Oral, resp. rate 20, height 5\' 3"  (1.6 m), weight 202 lb 6.4 oz (91.808 kg). ECOG: 0 General appearance: alert, cooperative and no distress Head: Normocephalic, without obvious abnormality, atraumatic Neck: no adenopathy, no carotid bruit, no JVD, supple, symmetrical, trachea midline and thyroid not enlarged, symmetric, no tenderness/mass/nodules Lymph nodes: Cervical, supraclavicular, and axillary nodes normal. Heart:regular rate and rhythm, S1, S2 normal, no murmur,  click, rub or gallop Lung:chest clear, no wheezing, rales, normal symmetric air entry Abdomen: soft, non-tender, without masses or organomegaly EXT:no erythema,  induration, or nodules  Lab Results: Lab Results  Component Value Date   WBC 6.2 01/01/2012   HGB 12.7 01/01/2012   HCT 37.6 01/01/2012   MCV 85.5 01/01/2012   PLT 129* 01/01/2012     Chemistry      Component Value Date/Time   NA 137 10/01/2011 1056   K 4.3 10/01/2011 1056   CL 106 10/01/2011 1056   CO2 25 10/01/2011 1056   BUN 20 10/01/2011 1056   CREATININE 2.26* 10/01/2011 1056      Component Value Date/Time   CALCIUM 8.4 10/01/2011 1056   ALKPHOS 41 10/01/2011 1056   AST 16 10/01/2011 1056   ALT <8 10/01/2011 1056   BILITOT 0.3 10/01/2011 1056      Impression and Plan: This is a 36 year old female with the following issues: 1. Anemia. Primarily due to iron deficiency, but likely has a component of renal failure. She did a lot better since her IV Fe. I have asked to stop her po iron given that her Hgb has normalized at this time. We will check her back again in 4 months and repeat Fe infusion as needed. 2. 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 Creatinine 2.0 in April 2013.  3. Heavy menstrual bleeding. Patient is S/Pablation procedure which has helped her bleeding. 4. Follow-up. In 4 months with repeat labs at that time.  [facility] 8/16/20133:09 PM

## 2012-01-01 NOTE — Telephone Encounter (Signed)
gv pt appt schedule for December. °

## 2012-01-04 ENCOUNTER — Telehealth: Payer: Self-pay | Admitting: *Deleted

## 2012-01-04 NOTE — Telephone Encounter (Signed)
As of 01-04-2012 patient as been scheduled for the 4 month appointment

## 2012-02-22 ENCOUNTER — Other Ambulatory Visit: Payer: Self-pay | Admitting: Nephrology

## 2012-02-29 ENCOUNTER — Ambulatory Visit
Admission: RE | Admit: 2012-02-29 | Discharge: 2012-02-29 | Disposition: A | Discharge status: Home / Self Care | Payer: Medicaid Other | Source: Ambulatory Visit | Attending: Nephrology | Admitting: Nephrology

## 2012-03-18 DIAGNOSIS — T8859XA Other complications of anesthesia, initial encounter: Secondary | ICD-10-CM

## 2012-03-18 HISTORY — DX: Other complications of anesthesia, initial encounter: T88.59XA

## 2012-03-28 ENCOUNTER — Other Ambulatory Visit (HOSPITAL_COMMUNITY): Payer: Self-pay | Admitting: Nephrology

## 2012-03-28 DIAGNOSIS — N184 Chronic kidney disease, stage 4 (severe): Secondary | ICD-10-CM

## 2012-03-28 DIAGNOSIS — N039 Chronic nephritic syndrome with unspecified morphologic changes: Secondary | ICD-10-CM

## 2012-03-28 DIAGNOSIS — R809 Proteinuria, unspecified: Secondary | ICD-10-CM

## 2012-04-08 ENCOUNTER — Encounter (HOSPITAL_COMMUNITY): Payer: Self-pay | Admitting: Pharmacy Technician

## 2012-04-11 ENCOUNTER — Other Ambulatory Visit: Payer: Self-pay | Admitting: Radiology

## 2012-04-15 ENCOUNTER — Encounter (HOSPITAL_COMMUNITY): Payer: Self-pay

## 2012-04-15 ENCOUNTER — Ambulatory Visit (HOSPITAL_COMMUNITY)
Admission: RE | Admit: 2012-04-15 | Discharge: 2012-04-15 | Disposition: A | Discharge status: Home / Self Care | Payer: Medicaid Other | Source: Ambulatory Visit | Attending: Nephrology | Admitting: Nephrology

## 2012-04-15 ENCOUNTER — Other Ambulatory Visit (HOSPITAL_COMMUNITY): Payer: Medicaid Other

## 2012-04-15 DIAGNOSIS — L930 Discoid lupus erythematosus: Secondary | ICD-10-CM | POA: Insufficient documentation

## 2012-04-15 DIAGNOSIS — R809 Proteinuria, unspecified: Secondary | ICD-10-CM | POA: Insufficient documentation

## 2012-04-15 DIAGNOSIS — N184 Chronic kidney disease, stage 4 (severe): Secondary | ICD-10-CM

## 2012-04-15 DIAGNOSIS — N039 Chronic nephritic syndrome with unspecified morphologic changes: Secondary | ICD-10-CM

## 2012-04-15 LAB — CBC
MCV: 90.8 fL (ref 78.0–100.0)
Platelets: 111 10*3/uL — ABNORMAL LOW (ref 150–400)
RDW: 13.2 % (ref 11.5–15.5)
WBC: 5.1 10*3/uL (ref 4.0–10.5)

## 2012-04-15 LAB — APTT: aPTT: 34 seconds (ref 24–37)

## 2012-04-15 LAB — PROTIME-INR: Prothrombin Time: 12.8 seconds (ref 11.6–15.2)

## 2012-04-15 MED ORDER — FENTANYL CITRATE 0.05 MG/ML IJ SOLN
INTRAMUSCULAR | Status: AC | PRN
  Administered 2012-04-15: 50 ug via INTRAVENOUS
  Administered 2012-04-15: 25 ug via INTRAVENOUS

## 2012-04-15 MED ORDER — MIDAZOLAM HCL 2 MG/2ML IJ SOLN
INTRAMUSCULAR | Status: AC | PRN
  Administered 2012-04-15: 1 mg via INTRAVENOUS
  Administered 2012-04-15: 0.5 mg via INTRAVENOUS

## 2012-04-15 MED ORDER — FENTANYL CITRATE 0.05 MG/ML IJ SOLN
INTRAMUSCULAR | Status: AC
  Filled 2012-04-15: qty 4

## 2012-04-15 MED ORDER — SODIUM CHLORIDE 0.9 % IV SOLN: Freq: Once | INTRAVENOUS | Status: DC

## 2012-04-15 MED ORDER — MIDAZOLAM HCL 2 MG/2ML IJ SOLN
INTRAMUSCULAR | Status: AC
  Filled 2012-04-15: qty 4

## 2012-04-15 NOTE — ED Notes (Signed)
Pt given coke- tolerated well.  

## 2012-04-15 NOTE — ED Notes (Signed)
Requested bed from short stay, spoke with Melanie RN 

## 2012-04-15 NOTE — Procedures (Signed)
Interventional Radiology Procedure Note  Procedure: US guided core biopsy, 2 16G cores obtained Complications: None immediate Recommendations: - Bedrest x 4 hrs, first hour prone - ADAT after 2 hrs  Signed,  Sterling Big, MD Vascular & Interventional Radiologist [facility] Radiology

## 2012-04-15 NOTE — Progress Notes (Signed)
DR [facility] NOTIFIED OF BLOOD NOTED AT BIOPSY SITE WHEN CLIENT GOT UP TO GE DRESSED AND DRESSING CHANGED TO 2X2 WITH HYPAFIX AND NO FURTHER BLOOD NOTED; NO NEW ORDERS; CLIENT WITHOUT COMPLAINTS. ADVISED TO CALL [phone] IF ANY PROBLEMS,QUESTIONS, OR CONCERNS AND VOICED UNDERSTANDING

## 2012-04-15 NOTE — H&P (Signed)
Lori Murphy is an 36 y.o. female.   Chief Complaint: pt with hx glumerulonephritis +proteinuria; + ANA; + RF Scheduled for random renal biopsy HPI: hypothyroid; asthma; Chronic GMN; anemia  Past Medical History  Diagnosis Date  . Thyroid disease     graves disease-radiation  . DUB (dysfunctional uterine bleeding)   . PID (acute pelvic inflammatory disease)   . Asthma   . Lung disease   . Anemia     microcytic hypochromic anemia-iron infusion at cancer center  . Renal disorder     cysts/lesions on kidney    Past Surgical History  Procedure Date  . Thyriod radiation   . Multiple tooth extractions 2012    6 teeth  . Cholecystectomy 2000  . Tubal ligation 2005  . Cervical polypectomy 10/01/2011    Procedure: CERVICAL POLYPECTOMY;  Surgeon: Adam Phenix, MD;  Location: [facility];  Service: Gynecology;  Laterality: N/A;    Family History  Problem Relation Age of Onset  . COPD Father   . Heart disease Father    Social History:  reports that she has never smoked. She has never used smokeless tobacco. She reports that she drinks alcohol. She reports that she does not use illicit drugs.  Allergies:  Allergies  Allergen Reactions  . Codeine Anaphylaxis and Swelling  . Food Anaphylaxis    Milk, coconut, mushrooms, seafood  . Penicillins Anaphylaxis and Swelling  . Cholestatin Other (See Comments)    Exacerbates asthma  . Other Itching    Pet hair     (Not in a hospital admission)  No results found for this or any previous visit (from the past 48 hour(s)). No results found.  Review of Systems  Constitutional: Negative for fever.  Respiratory: Negative for sputum production.   Gastrointestinal: Negative for nausea and vomiting.  Musculoskeletal: Negative for back pain.  Neurological: Negative for weakness and headaches.    Blood pressure 114/78, pulse 63, temperature 97.3 F (36.3 C), temperature source Oral, resp. rate 20, height 5\' 2"  (1.575 m), weight 197  lb (89.359 kg), SpO2 95.00%. Physical Exam  Constitutional: She is oriented to person, place, and time.  Cardiovascular: Normal rate, regular rhythm and normal heart sounds.   Respiratory: Effort normal and breath sounds normal.  GI: Soft. Bowel sounds are normal. There is no tenderness.  Musculoskeletal: Normal range of motion.  Neurological: She is alert and oriented to person, place, and time.  Psychiatric: She has a normal mood and affect. Judgment and thought content normal.     Assessment/Plan Hx GMN; + ANA; +RF; proteinuria Scheduled for renal bx Pt aware of procedure benefits and risks and agreeable to proceed Consent signed and in chart  Lori Murphy A 04/15/2012, 9:21 AM

## 2012-04-15 NOTE — H&P (Signed)
Agree with PA note.    Signed,  Alysah Carton K. James Senn, MD Vascular & Interventional Radiologist  Radiology  

## 2012-04-15 NOTE — ED Notes (Signed)
Patient denies pain and is resting comfortably. Pt to be prone for 1 hour

## 2012-04-20 ENCOUNTER — Ambulatory Visit: Payer: Medicaid Other | Admitting: Obstetrics & Gynecology

## 2012-05-03 ENCOUNTER — Encounter: Payer: Self-pay | Admitting: Oncology

## 2012-05-03 ENCOUNTER — Other Ambulatory Visit (HOSPITAL_BASED_OUTPATIENT_CLINIC_OR_DEPARTMENT_OTHER): Payer: Medicaid Other | Admitting: Lab

## 2012-05-03 ENCOUNTER — Ambulatory Visit (HOSPITAL_BASED_OUTPATIENT_CLINIC_OR_DEPARTMENT_OTHER): Payer: Medicaid Other | Admitting: Oncology

## 2012-05-03 ENCOUNTER — Telehealth: Payer: Self-pay | Admitting: Oncology

## 2012-05-03 VITALS — BP 117/77 | HR 76 | Temp 98.1°F | Resp 18 | Ht 62.0 in | Wt 203.9 lb

## 2012-05-03 DIAGNOSIS — D649 Anemia, unspecified: Secondary | ICD-10-CM

## 2012-05-03 LAB — CBC WITH DIFFERENTIAL/PLATELET
BASO%: 1.1 % (ref 0.0–2.0)
LYMPH%: 20.1 % (ref 14.0–49.7)
MCHC: 33.9 g/dL (ref 31.5–36.0)
MONO#: 0.3 10*3/uL (ref 0.1–0.9)
MONO%: 4.6 % (ref 0.0–14.0)
Platelets: 147 10*3/uL (ref 145–400)
RBC: 3.96 10*6/uL (ref 3.70–5.45)
RDW: 13.8 % (ref 11.2–14.5)
WBC: 6.4 10*3/uL (ref 3.9–10.3)

## 2012-05-03 LAB — COMPREHENSIVE METABOLIC PANEL (CC13)
BUN: 17 mg/dL (ref 7.0–26.0)
CO2: 27 mEq/L (ref 22–29)
Calcium: 8.8 mg/dL (ref 8.4–10.4)
Chloride: 108 mEq/L — ABNORMAL HIGH (ref 98–107)
Creatinine: 2.5 mg/dL — ABNORMAL HIGH (ref 0.6–1.1)

## 2012-05-03 NOTE — Telephone Encounter (Signed)
appts made and printed for pt aom °

## 2012-05-03 NOTE — Progress Notes (Signed)
Hematology and Oncology Follow Up Visit  Lori Murphy [id number] [birth year] 36 y.o. 05/03/2012 7:58 PM HOOPER,JEFFREY C, MDHooper, Lacretia Leigh, MD   Principle Diagnosis: Iron deficiency anemia due do heavy menstrual bleeding. The patient likely has a component of anemia due to renal insufficiency.  Current therapy: She is S/P IV iron on 10/19/2011 for a total of 1020 mg of Feraheme.   Interim History: Lori Murphy is a 36 year old female seen today for follow-up of anemia. States she was seen in the ER in [facility] this past weekend due to rectal bleeding. States she was diagnosed with a UTI and sent home with antibiotics. Bleeding is bright red and is less now since this past weekend.. She reports bloating and loose stools which pre-dates the start of the oral iron. Her menstrual bleeding has improved since the last visit as well after the ablation procedure; now only spotting. Denies hemoptysis, hematemesis, melena, and hematuria. Denies chest pain, shortness of breath, and dyspnea. She had a recent renal biopsy due to worsening renal function, results are pending.  Medications: I have reviewed the patient's current medications. Current outpatient prescriptions:acetaminophen (TYLENOL) 325 MG tablet, Take 2 tablets (650 mg total) by mouth every 6 (six) hours as needed for pain or fever (or Fever >/= 101)., Disp: , Rfl: ;  albuterol (PROVENTIL HFA;VENTOLIN HFA) 108 (90 BASE) MCG/ACT inhaler, Inhale 2 puffs into the lungs every 6 (six) hours as needed. For shortness of breath, Disp: , Rfl:  Fluticasone-Salmeterol (ADVAIR) 100-50 MCG/DOSE AEPB, Inhale 1 puff into the lungs every 12 (twelve) hours., Disp: , Rfl: ;  gabapentin (NEURONTIN) 100 MG capsule, Take 100 mg by mouth 3 (three) times daily., Disp: , Rfl: ;  levothyroxine (SYNTHROID, LEVOTHROID) 150 MCG tablet, Take 150 mcg by mouth daily., Disp: , Rfl:  lidocaine (LIDODERM) 5 %, Place 1 patch onto the skin 2 (two) times daily. Remove &  Discard patch within 12 hours or as directed by MD, Disp: , Rfl: ;  lubiprostone (AMITIZA) 24 MCG capsule, Take 24 mcg by mouth 2 (two) times daily with a meal., Disp: , Rfl: ;  pantoprazole (PROTONIX) 40 MG tablet, Take 40 mg by mouth daily., Disp: , Rfl:  tiZANidine (ZANAFLEX) 2 MG tablet, Take 2 mg by mouth 2 (two) times daily as needed. For muscle pain., Disp: , Rfl: ;  topiramate (TOPAMAX) 25 MG tablet, Take 25 mg by mouth at bedtime., Disp: , Rfl: ;  traMADol (ULTRAM) 50 MG tablet, Take 50 mg by mouth 2 (two) times daily., Disp: , Rfl:   Allergies:  Allergies  Allergen Reactions  . Codeine Anaphylaxis and Swelling  . Food Anaphylaxis    Milk, coconut, mushrooms, seafood  . Penicillins Anaphylaxis and Swelling  . Cholestatin Other (See Comments)    Exacerbates asthma  . Other Itching    Pet hair    Past Medical History, Surgical history, Social history, and Family History were reviewed and updated.  Review of Systems: Constitutional:  Negative for fever, chills, night sweats, anorexia, weight loss, pain. Cardiovascular: no chest pain or dyspnea on exertion Respiratory: no cough, shortness of breath, or wheezing Neurological: no TIA or stroke symptoms Dermatological: negative ENT: negative Skin: Negative. Gastrointestinal: reports bloating after eating. Loose stools that are watery to semi-formed.  Genito-Urinary: no dysuria, trouble voiding, or hematuria Hematological and Lymphatic: negative Breast: negative for breast lumps Musculoskeletal: negative Remaining ROS negative.  Physical Exam: Blood pressure 117/77, pulse 76, temperature 98.1 F (36.7 C), temperature source Oral, resp.  rate 18, height 5\' 2"  (1.575 m), weight 203 lb 14.4 oz (92.488 kg), last menstrual period 09/22/2011. ECOG: 0 General appearance: alert, cooperative and no distress Head: Normocephalic, without obvious abnormality, atraumatic Neck: no adenopathy, no carotid bruit, no JVD, supple, symmetrical,  trachea midline and thyroid not enlarged, symmetric, no tenderness/mass/nodules Lymph nodes: Cervical, supraclavicular, and axillary nodes normal. Heart:regular rate and rhythm, S1, S2 normal, no murmur, click, rub or gallop Lung:chest clear, no wheezing, rales, normal symmetric air entry Abdomen: soft, non-tender, without masses or organomegaly EXT:no erythema, induration, or nodules  Lab Results: Lab Results  Component Value Date   WBC 6.4 05/03/2012   HGB 12.6 05/03/2012   HCT 37.3 05/03/2012   MCV 94.3 05/03/2012   PLT 147 05/03/2012     Chemistry      Component Value Date/Time   NA 142 05/03/2012 1334   NA 139 01/01/2012 1425   K 4.3 05/03/2012 1334   K 4.1 01/01/2012 1425   CL 108* 05/03/2012 1334   CL 107 01/01/2012 1425   CO2 27 05/03/2012 1334   CO2 25 01/01/2012 1425   BUN 17.0 05/03/2012 1334   BUN 19 01/01/2012 1425   CREATININE 2.5* 05/03/2012 1334   CREATININE 2.48* 01/01/2012 1425      Component Value Date/Time   CALCIUM 8.8 05/03/2012 1334   CALCIUM 8.9 01/01/2012 1425   ALKPHOS 47 05/03/2012 1334   ALKPHOS 41 01/01/2012 1425   AST 22 05/03/2012 1334   AST 32 01/01/2012 1425   ALT 13 05/03/2012 1334   ALT 14 01/01/2012 1425   BILITOT 0.32 05/03/2012 1334   BILITOT 0.3 01/01/2012 1425      Impression and Plan: This is a 36 year old female with the following issues: 1. Anemia. Primarily due to iron deficiency, but likely has a component of renal failure. She did a lot better since her IV Fe.Hgb is normal today. Iron studies are pending. If iron studies are low, will proceed with additional IV iron. 2. 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 Creatinine 2.5 today. She is followed by Nephrology. Renal bx results pending.  3. Heavy menstrual bleeding. Patient is S/Pablation procedure which has helped her bleeding. 4. Rectal bleeding. Unclear etiology. She is seeing PCP for this. Hgb is normal today. 5. Follow-up. In 4 months with repeat labs at that  time.  Keondre Markson 12/17/20137:58 PM

## 2012-05-04 LAB — IRON AND TIBC
%SAT: 20 % (ref 20–55)
Iron: 57 ug/dL (ref 42–145)
TIBC: 291 ug/dL (ref 250–470)
UIBC: 234 ug/dL (ref 125–400)

## 2012-07-12 ENCOUNTER — Other Ambulatory Visit (HOSPITAL_COMMUNITY): Payer: Self-pay | Admitting: Gastroenterology

## 2012-07-12 DIAGNOSIS — R112 Nausea with vomiting, unspecified: Secondary | ICD-10-CM

## 2012-07-21 ENCOUNTER — Ambulatory Visit (HOSPITAL_COMMUNITY): Payer: Medicaid Other

## 2012-08-01 ENCOUNTER — Encounter (HOSPITAL_COMMUNITY)
Admission: RE | Admit: 2012-08-01 | Discharge: 2012-08-01 | Disposition: A | Discharge status: Home / Self Care | Payer: Medicaid Other | Source: Ambulatory Visit | Attending: Gastroenterology | Admitting: Gastroenterology

## 2012-08-01 DIAGNOSIS — R6881 Early satiety: Secondary | ICD-10-CM

## 2012-08-01 DIAGNOSIS — R109 Unspecified abdominal pain: Secondary | ICD-10-CM | POA: Insufficient documentation

## 2012-08-01 DIAGNOSIS — R112 Nausea with vomiting, unspecified: Secondary | ICD-10-CM | POA: Insufficient documentation

## 2012-08-01 MED ORDER — TECHNETIUM TC 99M SULFUR COLLOID
2.0000 | Freq: Once | INTRAVENOUS | Status: AC | PRN
  Administered 2012-08-01: 2 via INTRAVENOUS

## 2012-08-25 ENCOUNTER — Encounter (HOSPITAL_COMMUNITY): Payer: Self-pay | Admitting: *Deleted

## 2012-08-26 ENCOUNTER — Encounter (HOSPITAL_COMMUNITY): Payer: Self-pay | Admitting: *Deleted

## 2012-08-30 ENCOUNTER — Telehealth: Payer: Self-pay | Admitting: Oncology

## 2012-08-31 ENCOUNTER — Ambulatory Visit: Payer: Medicaid Other | Admitting: Oncology

## 2012-08-31 ENCOUNTER — Other Ambulatory Visit: Payer: Medicaid Other | Admitting: Lab

## 2012-09-12 ENCOUNTER — Encounter (HOSPITAL_COMMUNITY): Payer: Self-pay | Admitting: Pharmacy Technician

## 2012-09-13 ENCOUNTER — Other Ambulatory Visit: Payer: Self-pay | Admitting: Gastroenterology

## 2012-09-13 NOTE — Addendum Note (Signed)
Addended by: Jordy Verba on: 09/13/2012 01:30 PM   Modules accepted: Orders  

## 2012-09-14 ENCOUNTER — Ambulatory Visit (HOSPITAL_COMMUNITY): Payer: Medicaid Other | Admitting: Certified Registered Nurse Anesthetist

## 2012-09-14 ENCOUNTER — Encounter (HOSPITAL_COMMUNITY)
Admission: RE | Disposition: A | Discharge status: Home / Self Care | Payer: Self-pay | Source: Ambulatory Visit | Attending: Gastroenterology

## 2012-09-14 ENCOUNTER — Ambulatory Visit (HOSPITAL_COMMUNITY)
Admission: RE | Admit: 2012-09-14 | Discharge: 2012-09-14 | Disposition: A | Discharge status: Home / Self Care | Payer: Medicaid Other | Source: Ambulatory Visit | Attending: Gastroenterology | Admitting: Gastroenterology

## 2012-09-14 ENCOUNTER — Encounter (HOSPITAL_COMMUNITY): Payer: Self-pay

## 2012-09-14 ENCOUNTER — Encounter (HOSPITAL_COMMUNITY): Payer: Self-pay | Admitting: Certified Registered Nurse Anesthetist

## 2012-09-14 DIAGNOSIS — R6881 Early satiety: Secondary | ICD-10-CM | POA: Insufficient documentation

## 2012-09-14 DIAGNOSIS — R110 Nausea: Secondary | ICD-10-CM | POA: Insufficient documentation

## 2012-09-14 DIAGNOSIS — Q391 Atresia of esophagus with tracheo-esophageal fistula: Secondary | ICD-10-CM | POA: Insufficient documentation

## 2012-09-14 DIAGNOSIS — K209 Esophagitis, unspecified without bleeding: Secondary | ICD-10-CM | POA: Insufficient documentation

## 2012-09-14 DIAGNOSIS — K449 Diaphragmatic hernia without obstruction or gangrene: Secondary | ICD-10-CM | POA: Insufficient documentation

## 2012-09-14 DIAGNOSIS — K644 Residual hemorrhoidal skin tags: Secondary | ICD-10-CM | POA: Insufficient documentation

## 2012-09-14 DIAGNOSIS — K625 Hemorrhage of anus and rectum: Secondary | ICD-10-CM | POA: Insufficient documentation

## 2012-09-14 DIAGNOSIS — Q393 Congenital stenosis and stricture of esophagus: Secondary | ICD-10-CM | POA: Insufficient documentation

## 2012-09-14 HISTORY — PX: COLONOSCOPY WITH PROPOFOL: SHX5780

## 2012-09-14 HISTORY — DX: Unspecified osteoarthritis, unspecified site: M19.90

## 2012-09-14 HISTORY — PX: ESOPHAGOGASTRODUODENOSCOPY (EGD) WITH PROPOFOL: SHX5813

## 2012-09-14 HISTORY — DX: Adverse effect of unspecified anesthetic, initial encounter: T41.45XA

## 2012-09-14 HISTORY — DX: Gastro-esophageal reflux disease without esophagitis: K21.9

## 2012-09-14 HISTORY — DX: Hypothyroidism, unspecified: E03.9

## 2012-09-14 HISTORY — DX: Encounter for other specified aftercare: Z51.89

## 2012-09-14 SURGERY — ESOPHAGOGASTRODUODENOSCOPY (EGD) WITH PROPOFOL
Anesthesia: General

## 2012-09-14 MED ORDER — MIDAZOLAM HCL 5 MG/5ML IJ SOLN
INTRAMUSCULAR | Status: DC | PRN
  Administered 2012-09-14: 2 mg via INTRAVENOUS

## 2012-09-14 MED ORDER — SODIUM CHLORIDE 0.9 % IV SOLN: INTRAVENOUS | Status: DC

## 2012-09-14 MED ORDER — ONDANSETRON HCL 4 MG/2ML IJ SOLN
INTRAMUSCULAR | Status: DC | PRN
  Administered 2012-09-14: 4 mg via INTRAVENOUS

## 2012-09-14 MED ORDER — FENTANYL CITRATE 0.05 MG/ML IJ SOLN
INTRAMUSCULAR | Status: DC | PRN
  Administered 2012-09-14 (×2): 50 ug via INTRAVENOUS

## 2012-09-14 MED ORDER — BUTAMBEN-TETRACAINE-BENZOCAINE 2-2-14 % EX AERO
INHALATION_SPRAY | CUTANEOUS | Status: DC | PRN
  Administered 2012-09-14: 2 via TOPICAL

## 2012-09-14 MED ORDER — LACTATED RINGERS IV SOLN
INTRAVENOUS | Status: DC
  Administered 2012-09-14: 10:00:00 via INTRAVENOUS

## 2012-09-14 MED ORDER — LIDOCAINE HCL (CARDIAC) 20 MG/ML IV SOLN
INTRAVENOUS | Status: DC | PRN
  Administered 2012-09-14: 100 mg via INTRAVENOUS

## 2012-09-14 MED ORDER — PROPOFOL INFUSION 10 MG/ML OPTIME
INTRAVENOUS | Status: DC | PRN
  Administered 2012-09-14: 160 ug/kg/min via INTRAVENOUS

## 2012-09-14 MED ORDER — KETAMINE HCL 10 MG/ML IJ SOLN
INTRAMUSCULAR | Status: DC | PRN
  Administered 2012-09-14: 20 mg via INTRAVENOUS

## 2012-09-14 SURGICAL SUPPLY — 24 items

## 2012-09-14 NOTE — Preoperative (Signed)
Beta Blockers   Reason not to administer Beta Blockers:Not Applicable 

## 2012-09-14 NOTE — Anesthesia Preprocedure Evaluation (Addendum)
Anesthesia Evaluation  Patient identified by MRN, date of birth, ID band Patient awake    Reviewed: Allergy & Precautions, H&P , NPO status , Patient's Chart, lab work & pertinent test results  History of Anesthesia Complications (+) PONV  Airway       Dental   Pulmonary asthma ,  Took inhaler today.         Cardiovascular Exercise Tolerance: Good negative cardio ROS   ECHO reviewed from 2011. 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 Now, occasional chest pain.   Neuro/Psych  Headaches, negative psych ROS   GI/Hepatic Neg liver ROS, GERD-  Medicated,  Endo/Other  Hypothyroidism   Renal/GU Renal InsufficiencyRenal diseaseFSGS: Focal segmental glomerulosclerosis. Last creatinine 2.5  negative genitourinary   Musculoskeletal negative musculoskeletal ROS (+)   Abdominal (+) + obese,   Peds negative pediatric ROS (+)  Hematology negative hematology ROS (+)   Anesthesia Other Findings   Reproductive/Obstetrics S/P tubal ligation. Denies possibility of pregnancy.                         Anesthesia Physical Anesthesia Plan  ASA: III  Anesthesia Plan: MAC   Post-op Pain Management:    Induction: Intravenous  Airway Management Planned:   Additional Equipment:   Intra-op Plan:   Post-operative Plan:   Informed Consent: I have reviewed the patients History and Physical, chart, labs and discussed the procedure including the risks, benefits and alternatives for the proposed anesthesia with the patient or authorized representative who has indicated his/her understanding and acceptance.   Dental advisory given  Plan Discussed with: CRNA  Anesthesia Plan Comments:        Anesthesia Quick Evaluation

## 2012-09-14 NOTE — Anesthesia Postprocedure Evaluation (Signed)
  Anesthesia Post-op Note  Patient: Lori Murphy  Procedure(s) Performed: Procedure(s) (LRB): ESOPHAGOGASTRODUODENOSCOPY (EGD) WITH PROPOFOL (N/A) COLONOSCOPY WITH PROPOFOL (N/A)  Patient Location: PACU  Anesthesia Type: MAC  Level of Consciousness: awake and alert   Airway and Oxygen Therapy: Patient Spontanous Breathing  Post-op Pain: mild  Post-op Assessment: Post-op Vital signs reviewed, Patient's Cardiovascular Status Stable, Respiratory Function Stable, Patent Airway and No signs of Nausea or vomiting  Last Vitals:  Filed Vitals:   09/14/12 1126  BP: 126/80  Pulse:   Temp:   Resp: 16    Post-op Vital Signs: stable   Complications: No apparent anesthesia complications

## 2012-09-14 NOTE — Op Note (Signed)
[facility] [address] [place], [address]   COLONOSCOPY PROCEDURE REPORT  PATIENT: Lori Murphy, Lori Murphy  MR#: [id number] BIRTHDATE: 06-07-1975 , 36  yrs. old GENDER: Female ENDOSCOPIST: Willis Modena, MD REFERRED BY:  Caroline More, MD PROCEDURE DATE:  09/14/2012 PROCEDURE:   Colonoscopy, diagnostic ASA CLASS:   Class II INDICATIONS:blood in stool. MEDICATIONS: MAC sedation, administered by CRNA  DESCRIPTION OF PROCEDURE:   After the risks benefits and alternatives of the procedure were thoroughly explained, informed consent was obtained.  A digital rectal exam revealed no abnormalities of the rectum.   The Pentax Ped Colon [id number] endoscope was introduced through the anus and advanced to the cecum, which was identified by both the appendix and ileocecal valve, as well as the terminal ileum. No adverse events experienced.   The quality of the prep was adequate.  The instrument was then slowly withdrawn as the colon was fully examined.    Findings:  External hemorrhoids, otherwise normal digital rectal exam.  Prep quality ok.  No polyps, masses, vascular ectasias, or  inflammatory changes were seen.  Normal distal 5cm of the terminal ileum.  Retroflexed view of rectum was normal. Withdrawal time was about 10 minutes.  The scope was withdrawn and the procedure completed.  ENDOSCOPIC IMPRESSION:     As above.  Suspect intermittent hematochezia is due to external hemorrhoids.  RECOMMENDATIONS:     1.  Watch for potential complications of procedure. 2.  High fiber diet. 3.  Topical therapies (e.g., Preparation-H) as needed for hemorrhoidal bleeding. 4.  Follow-up with [place] in [facility] in 6-8 weeks for ongoing management of her nausea and bloating.  eSigned:  Willis Modena, MD 09/14/2012 11:13 AM   cc:

## 2012-09-14 NOTE — Transfer of Care (Signed)
Immediate Anesthesia Transfer of Care Note  Patient: LEETTA HENDRIKS  Procedure(s) Performed: Procedure(s) (LRB): ESOPHAGOGASTRODUODENOSCOPY (EGD) WITH PROPOFOL (N/A) COLONOSCOPY WITH PROPOFOL (N/A)  Patient Location: PACU  Anesthesia Type: MAC  Level of Consciousness: sedated, patient cooperative and responds to stimulaton  Airway & Oxygen Therapy: Patient Spontanous Breathing and Patient connected to face mask oxgen  Post-op Assessment: Report given to PACU RN and Post -op Vital signs reviewed and stable  Post vital signs: Reviewed and stable  Complications: No apparent anesthesia complications

## 2012-09-14 NOTE — H&P (Signed)
Patient interval history reviewed.  Patient examined again.  There has been no change from documented H/P dated 08/23/12 (scanned into chart from our office) except as documented above.  Assessment:  1.  Early satiety. 2.  Nausea. 3.  Blood in stool.  Plan:  1.  Endoscopy. 2.  Risks (bleeding, infection, bowel perforation that could require surgery, sedation-related changes in cardiopulmonary systems), benefits (identification and possible treatment of source of symptoms, exclusion of certain causes of symptoms), and alternatives (watchful waiting, radiographic imaging studies, empiric medical treatment) of upper endoscopy (EGD) were explained to patient/family in detail and patient wishes to proceed. 3.  Colonoscopy. 4.  Risks (bleeding, infection, bowel perforation that could require surgery, sedation-related changes in cardiopulmonary systems), benefits (identification and possible treatment of source of symptoms, exclusion of certain causes of symptoms), and alternatives (watchful waiting, radiographic imaging studies, empiric medical treatment) of colonoscopy were explained to patient/family in detail and patient wishes to proceed.

## 2012-09-14 NOTE — Op Note (Signed)
[facility] [address] [place], [address]   ENDOSCOPY PROCEDURE REPORT  PATIENT: Lori, Murphy  MR#: [id number] BIRTHDATE: 12/27/1975 , 36  yrs. old GENDER: Female ENDOSCOPIST: Willis Modena, MD REFERRED BY:  Caroline More, MD PROCEDURE DATE:  09/14/2012 PROCEDURE:  EGD, diagnostic ASA CLASS:     Class II INDICATIONS:  nausea, bloating, blood in stool. MEDICATIONS: MAC sedation, administered by CRNA TOPICAL ANESTHETIC: Cetacaine Spray  DESCRIPTION OF PROCEDURE: After the risks benefits and alternatives of the procedure were thoroughly explained, informed consent was obtained.  The diagnostic forward-viewing  endoscope was introduced through the mouth and advanced to the second portion of the duodenum. Without limitations.  The instrument was slowly withdrawn as the mucosa was fully examined.    Findings:  Small hiatal hernia.  Widely patent Schatzki's ring (not dilated, as she is having no dysphagia).  Mild (LA-A) distal esophagitis.  Remainder of endoscopy normal to the second portion of the duodenum.            The scope was then withdrawn from the patient and the procedure completed.  ENDOSCOPIC IMPRESSION:     As above.  Esophagitis could be causing some of her nausea symptoms.  RECOMMENDATIONS:     1.  Watch for potential complications of procedure. 2.  Trial of omeprazole OTC 20 mg once-a-day for nausea symptoms. 3.  Continue pancreatic enzymes Ellouise Newer), which seems to be helping her bloating symptoms. 3.  Proceed with colonoscopy today.  eSigned:  Willis Modena, MD 09/14/2012 11:08 AM   CC:

## 2012-09-15 ENCOUNTER — Encounter (HOSPITAL_COMMUNITY): Payer: Self-pay | Admitting: Gastroenterology

## 2012-10-04 ENCOUNTER — Ambulatory Visit: Payer: Medicaid Other | Admitting: Oncology

## 2012-10-04 ENCOUNTER — Other Ambulatory Visit: Payer: Medicaid Other | Admitting: Lab

## 2014-03-19 ENCOUNTER — Encounter (HOSPITAL_COMMUNITY): Payer: Self-pay | Admitting: Gastroenterology

## 2017-06-24 DIAGNOSIS — R002 Palpitations: Secondary | ICD-10-CM | POA: Diagnosis not present

## 2017-06-24 DIAGNOSIS — B9689 Other specified bacterial agents as the cause of diseases classified elsewhere: Secondary | ICD-10-CM | POA: Diagnosis not present

## 2017-06-24 DIAGNOSIS — N939 Abnormal uterine and vaginal bleeding, unspecified: Secondary | ICD-10-CM | POA: Diagnosis not present

## 2017-06-24 DIAGNOSIS — N760 Acute vaginitis: Secondary | ICD-10-CM | POA: Diagnosis not present

## 2017-06-24 DIAGNOSIS — Z98891 History of uterine scar from previous surgery: Secondary | ICD-10-CM | POA: Diagnosis not present

## 2017-09-07 DIAGNOSIS — S93402A Sprain of unspecified ligament of left ankle, initial encounter: Secondary | ICD-10-CM | POA: Diagnosis not present

## 2017-09-07 DIAGNOSIS — S99912A Unspecified injury of left ankle, initial encounter: Secondary | ICD-10-CM | POA: Diagnosis not present

## 2017-09-07 DIAGNOSIS — M25572 Pain in left ankle and joints of left foot: Secondary | ICD-10-CM | POA: Diagnosis not present

## 2017-09-07 DIAGNOSIS — S8992XA Unspecified injury of left lower leg, initial encounter: Secondary | ICD-10-CM | POA: Diagnosis not present

## 2017-09-07 DIAGNOSIS — M79605 Pain in left leg: Secondary | ICD-10-CM | POA: Diagnosis not present

## 2017-09-07 DIAGNOSIS — M7989 Other specified soft tissue disorders: Secondary | ICD-10-CM | POA: Diagnosis not present

## 2017-10-18 DIAGNOSIS — J449 Chronic obstructive pulmonary disease, unspecified: Secondary | ICD-10-CM | POA: Diagnosis not present

## 2017-10-18 DIAGNOSIS — E669 Obesity, unspecified: Secondary | ICD-10-CM | POA: Diagnosis not present

## 2017-10-18 DIAGNOSIS — Z1331 Encounter for screening for depression: Secondary | ICD-10-CM | POA: Diagnosis not present

## 2017-10-18 DIAGNOSIS — Z6837 Body mass index (BMI) 37.0-37.9, adult: Secondary | ICD-10-CM | POA: Diagnosis not present

## 2017-10-18 DIAGNOSIS — K581 Irritable bowel syndrome with constipation: Secondary | ICD-10-CM | POA: Diagnosis not present

## 2017-10-27 DIAGNOSIS — H35361 Drusen (degenerative) of macula, right eye: Secondary | ICD-10-CM | POA: Diagnosis not present

## 2017-10-27 DIAGNOSIS — H02403 Unspecified ptosis of bilateral eyelids: Secondary | ICD-10-CM | POA: Diagnosis not present

## 2017-12-04 DIAGNOSIS — R0789 Other chest pain: Secondary | ICD-10-CM | POA: Diagnosis not present

## 2017-12-04 DIAGNOSIS — R0781 Pleurodynia: Secondary | ICD-10-CM | POA: Diagnosis not present

## 2017-12-04 DIAGNOSIS — S299XXA Unspecified injury of thorax, initial encounter: Secondary | ICD-10-CM | POA: Diagnosis not present

## 2018-01-26 DIAGNOSIS — K581 Irritable bowel syndrome with constipation: Secondary | ICD-10-CM | POA: Diagnosis not present

## 2018-01-26 DIAGNOSIS — J449 Chronic obstructive pulmonary disease, unspecified: Secondary | ICD-10-CM | POA: Diagnosis not present

## 2018-01-26 DIAGNOSIS — E0500 Thyrotoxicosis with diffuse goiter without thyrotoxic crisis or storm: Secondary | ICD-10-CM | POA: Diagnosis not present

## 2018-01-26 DIAGNOSIS — K219 Gastro-esophageal reflux disease without esophagitis: Secondary | ICD-10-CM | POA: Diagnosis not present

## 2018-02-01 DIAGNOSIS — E0500 Thyrotoxicosis with diffuse goiter without thyrotoxic crisis or storm: Secondary | ICD-10-CM | POA: Diagnosis not present

## 2019-08-31 DIAGNOSIS — M79671 Pain in right foot: Secondary | ICD-10-CM | POA: Diagnosis not present

## 2019-08-31 DIAGNOSIS — S9031XA Contusion of right foot, initial encounter: Secondary | ICD-10-CM | POA: Diagnosis not present

## 2019-08-31 DIAGNOSIS — Z87448 Personal history of other diseases of urinary system: Secondary | ICD-10-CM | POA: Diagnosis not present

## 2019-08-31 DIAGNOSIS — M791 Myalgia, unspecified site: Secondary | ICD-10-CM | POA: Diagnosis not present

## 2019-08-31 DIAGNOSIS — M10071 Idiopathic gout, right ankle and foot: Secondary | ICD-10-CM | POA: Diagnosis not present

## 2019-08-31 DIAGNOSIS — Z8679 Personal history of other diseases of the circulatory system: Secondary | ICD-10-CM | POA: Diagnosis not present

## 2019-11-13 DIAGNOSIS — R000 Tachycardia, unspecified: Secondary | ICD-10-CM | POA: Diagnosis not present

## 2019-11-13 DIAGNOSIS — I959 Hypotension, unspecified: Secondary | ICD-10-CM | POA: Diagnosis not present

## 2019-11-13 DIAGNOSIS — R55 Syncope and collapse: Secondary | ICD-10-CM | POA: Diagnosis not present

## 2019-11-13 DIAGNOSIS — M5489 Other dorsalgia: Secondary | ICD-10-CM | POA: Diagnosis not present

## 2019-11-13 DIAGNOSIS — G4489 Other headache syndrome: Secondary | ICD-10-CM | POA: Diagnosis not present

## 2019-11-13 DIAGNOSIS — R1084 Generalized abdominal pain: Secondary | ICD-10-CM | POA: Diagnosis not present

## 2019-11-14 DIAGNOSIS — R000 Tachycardia, unspecified: Secondary | ICD-10-CM | POA: Diagnosis not present

## 2020-03-18 DIAGNOSIS — Y999 Unspecified external cause status: Secondary | ICD-10-CM | POA: Diagnosis not present

## 2020-03-18 DIAGNOSIS — R110 Nausea: Secondary | ICD-10-CM | POA: Diagnosis not present

## 2020-03-18 DIAGNOSIS — S299XXA Unspecified injury of thorax, initial encounter: Secondary | ICD-10-CM | POA: Diagnosis not present

## 2022-03-06 ENCOUNTER — Ambulatory Visit: Payer: Self-pay | Admitting: Licensed Clinical Social Worker

## 2022-03-06 NOTE — Patient Outreach (Signed)
  Care Coordination   03/06/2022 Name: Lori Murphy MRN: [id number] DOB: [birth year]   Care Coordination Outreach Attempts:  An unsuccessful telephone outreach was attempted today to offer the patient information about available care coordination services as a benefit of their health plan.   Follow Up Plan:  Additional outreach attempts will be made to offer the patient care coordination information and services.   Encounter Outcome:  No Answer  Care Coordination Interventions Activated:  No   Care Coordination Interventions:  No, not indicated    Milus Height, BSW , MSW, [place] [facility] Care Management  [phone]

## 2022-04-16 ENCOUNTER — Telehealth: Payer: Self-pay

## 2022-04-16 NOTE — Patient Outreach (Signed)
  Care Coordination   04/16/2022 Name: SOLEY HARRISS MRN: [id number] DOB: 07/17/1975   Care Coordination Outreach Attempts:  A second unsuccessful outreach was attempted today to offer the patient with information about available care coordination services as a benefit of their health plan.     Follow Up Plan:  Additional outreach attempts will be made to offer the patient care coordination information and services.   Encounter Outcome:  No Answer   Care Coordination Interventions:  No, not indicated    Rowe Pavy, RN, BSN, [facility] [employer] [phone]

## 2022-04-22 ENCOUNTER — Telehealth: Payer: Self-pay

## 2022-04-22 NOTE — Patient Outreach (Signed)
  Care Coordination   04/22/2022 Name: Lori Murphy MRN: [id number] DOB: 16-Apr-1976   Care Coordination Outreach Attempts:  A third unsuccessful outreach was attempted today to offer the patient with information about available care coordination services as a benefit of their health plan.   Follow Up Plan:  No further outreach attempts will be made at this time. We have been unable to contact the patient to offer or enroll patient in care coordination services  Encounter Outcome:  No Answer   Care Coordination Interventions:  No, not indicated    Rowe Pavy, RN, BSN, CEN [facility] Coordinator [phone]

## 2022-09-18 DIAGNOSIS — D649 Anemia, unspecified: Secondary | ICD-10-CM | POA: Diagnosis not present

## 2022-09-18 DIAGNOSIS — R809 Proteinuria, unspecified: Secondary | ICD-10-CM | POA: Diagnosis not present

## 2022-09-18 DIAGNOSIS — N184 Chronic kidney disease, stage 4 (severe): Secondary | ICD-10-CM | POA: Diagnosis not present

## 2022-09-18 DIAGNOSIS — E039 Hypothyroidism, unspecified: Secondary | ICD-10-CM | POA: Diagnosis not present

## 2022-09-18 DIAGNOSIS — M1A9XX0 Chronic gout, unspecified, without tophus (tophi): Secondary | ICD-10-CM | POA: Diagnosis not present

## 2022-09-22 DIAGNOSIS — M1A9XX0 Chronic gout, unspecified, without tophus (tophi): Secondary | ICD-10-CM | POA: Diagnosis not present

## 2022-09-22 DIAGNOSIS — R809 Proteinuria, unspecified: Secondary | ICD-10-CM | POA: Diagnosis not present

## 2022-09-22 DIAGNOSIS — R300 Dysuria: Secondary | ICD-10-CM | POA: Diagnosis not present

## 2022-09-22 DIAGNOSIS — D649 Anemia, unspecified: Secondary | ICD-10-CM | POA: Diagnosis not present

## 2022-09-22 DIAGNOSIS — N184 Chronic kidney disease, stage 4 (severe): Secondary | ICD-10-CM | POA: Diagnosis not present

## 2022-10-31 DIAGNOSIS — M329 Systemic lupus erythematosus, unspecified: Secondary | ICD-10-CM | POA: Diagnosis not present

## 2022-10-31 DIAGNOSIS — R079 Chest pain, unspecified: Secondary | ICD-10-CM | POA: Diagnosis not present

## 2022-10-31 DIAGNOSIS — Z753 Unavailability and inaccessibility of health-care facilities: Secondary | ICD-10-CM | POA: Diagnosis not present

## 2022-10-31 DIAGNOSIS — R0789 Other chest pain: Secondary | ICD-10-CM | POA: Diagnosis not present

## 2022-10-31 DIAGNOSIS — R9431 Abnormal electrocardiogram [ECG] [EKG]: Secondary | ICD-10-CM | POA: Diagnosis not present

## 2022-10-31 DIAGNOSIS — N189 Chronic kidney disease, unspecified: Secondary | ICD-10-CM | POA: Diagnosis not present

## 2022-10-31 DIAGNOSIS — N184 Chronic kidney disease, stage 4 (severe): Secondary | ICD-10-CM | POA: Diagnosis not present

## 2022-10-31 DIAGNOSIS — I129 Hypertensive chronic kidney disease with stage 1 through stage 4 chronic kidney disease, or unspecified chronic kidney disease: Secondary | ICD-10-CM | POA: Diagnosis not present

## 2022-11-01 DIAGNOSIS — R0602 Shortness of breath: Secondary | ICD-10-CM | POA: Diagnosis not present

## 2022-11-01 DIAGNOSIS — R079 Chest pain, unspecified: Secondary | ICD-10-CM | POA: Diagnosis not present

## 2022-11-18 DIAGNOSIS — K5900 Constipation, unspecified: Secondary | ICD-10-CM | POA: Diagnosis not present

## 2022-11-18 DIAGNOSIS — R109 Unspecified abdominal pain: Secondary | ICD-10-CM | POA: Diagnosis not present

## 2022-11-18 DIAGNOSIS — D72829 Elevated white blood cell count, unspecified: Secondary | ICD-10-CM | POA: Diagnosis not present

## 2022-11-18 DIAGNOSIS — N261 Atrophy of kidney (terminal): Secondary | ICD-10-CM | POA: Diagnosis not present

## 2022-11-18 DIAGNOSIS — D649 Anemia, unspecified: Secondary | ICD-10-CM | POA: Diagnosis not present

## 2022-11-18 DIAGNOSIS — R918 Other nonspecific abnormal finding of lung field: Secondary | ICD-10-CM | POA: Diagnosis not present

## 2022-11-18 DIAGNOSIS — R1011 Right upper quadrant pain: Secondary | ICD-10-CM | POA: Diagnosis not present

## 2022-11-18 DIAGNOSIS — R1031 Right lower quadrant pain: Secondary | ICD-10-CM | POA: Diagnosis not present

## 2022-11-18 DIAGNOSIS — J984 Other disorders of lung: Secondary | ICD-10-CM | POA: Diagnosis not present

## 2022-12-28 DIAGNOSIS — Z760 Encounter for issue of repeat prescription: Secondary | ICD-10-CM | POA: Diagnosis not present

## 2022-12-28 DIAGNOSIS — J45909 Unspecified asthma, uncomplicated: Secondary | ICD-10-CM | POA: Diagnosis not present

## 2023-02-11 DIAGNOSIS — J4521 Mild intermittent asthma with (acute) exacerbation: Secondary | ICD-10-CM | POA: Diagnosis not present

## 2023-02-11 DIAGNOSIS — R079 Chest pain, unspecified: Secondary | ICD-10-CM | POA: Diagnosis not present

## 2023-02-19 DIAGNOSIS — E039 Hypothyroidism, unspecified: Secondary | ICD-10-CM | POA: Diagnosis not present

## 2023-02-19 DIAGNOSIS — M159 Polyosteoarthritis, unspecified: Secondary | ICD-10-CM | POA: Diagnosis not present

## 2023-02-19 DIAGNOSIS — K582 Mixed irritable bowel syndrome: Secondary | ICD-10-CM | POA: Diagnosis not present

## 2023-02-19 DIAGNOSIS — Z91148 Patient's other noncompliance with medication regimen for other reason: Secondary | ICD-10-CM | POA: Diagnosis not present

## 2023-02-19 DIAGNOSIS — M328 Other forms of systemic lupus erythematosus: Secondary | ICD-10-CM | POA: Diagnosis not present

## 2023-02-19 DIAGNOSIS — Z7689 Persons encountering health services in other specified circumstances: Secondary | ICD-10-CM | POA: Diagnosis not present

## 2023-02-19 DIAGNOSIS — J449 Chronic obstructive pulmonary disease, unspecified: Secondary | ICD-10-CM | POA: Diagnosis not present

## 2023-02-19 DIAGNOSIS — E7800 Pure hypercholesterolemia, unspecified: Secondary | ICD-10-CM | POA: Diagnosis not present

## 2023-02-19 DIAGNOSIS — N184 Chronic kidney disease, stage 4 (severe): Secondary | ICD-10-CM | POA: Diagnosis not present

## 2023-02-19 DIAGNOSIS — Z13220 Encounter for screening for lipoid disorders: Secondary | ICD-10-CM | POA: Diagnosis not present

## 2023-02-19 DIAGNOSIS — J45909 Unspecified asthma, uncomplicated: Secondary | ICD-10-CM | POA: Diagnosis not present

## 2023-07-30 DIAGNOSIS — N611 Abscess of the breast and nipple: Secondary | ICD-10-CM | POA: Diagnosis not present

## 2023-09-14 DIAGNOSIS — Z91148 Patient's other noncompliance with medication regimen for other reason: Secondary | ICD-10-CM | POA: Diagnosis not present

## 2023-09-14 DIAGNOSIS — J449 Chronic obstructive pulmonary disease, unspecified: Secondary | ICD-10-CM | POA: Diagnosis not present

## 2023-09-14 DIAGNOSIS — M328 Other forms of systemic lupus erythematosus: Secondary | ICD-10-CM | POA: Diagnosis not present

## 2023-09-14 DIAGNOSIS — E039 Hypothyroidism, unspecified: Secondary | ICD-10-CM | POA: Diagnosis not present

## 2023-09-14 DIAGNOSIS — I129 Hypertensive chronic kidney disease with stage 1 through stage 4 chronic kidney disease, or unspecified chronic kidney disease: Secondary | ICD-10-CM | POA: Diagnosis not present

## 2023-09-14 DIAGNOSIS — J45909 Unspecified asthma, uncomplicated: Secondary | ICD-10-CM | POA: Diagnosis not present

## 2023-09-14 DIAGNOSIS — Z0271 Encounter for disability determination: Secondary | ICD-10-CM | POA: Diagnosis not present

## 2023-09-14 DIAGNOSIS — N184 Chronic kidney disease, stage 4 (severe): Secondary | ICD-10-CM | POA: Diagnosis not present
